# Patient Record
Sex: Female | Born: 1945 | Race: White | Hispanic: No | Smoking: Never smoker
Health system: Southern US, Community
[De-identification: ages and names within clinical notes are randomized; demographics above are authoritative.]

## PROBLEM LIST (undated history)

## (undated) DIAGNOSIS — D6861 Antiphospholipid syndrome: Secondary | ICD-10-CM

## (undated) DIAGNOSIS — D751 Secondary polycythemia: Secondary | ICD-10-CM

## (undated) DIAGNOSIS — G43909 Migraine, unspecified, not intractable, without status migrainosus: Secondary | ICD-10-CM

## (undated) DIAGNOSIS — I639 Cerebral infarction, unspecified: Secondary | ICD-10-CM

## (undated) DIAGNOSIS — E7800 Pure hypercholesterolemia, unspecified: Secondary | ICD-10-CM

## (undated) HISTORY — DX: Secondary polycythemia: D75.1

## (undated) HISTORY — DX: Pure hypercholesterolemia, unspecified: E78.00

## (undated) HISTORY — DX: Cerebral infarction, unspecified: I63.9

## (undated) HISTORY — PX: NASAL SEPTUM SURGERY: SHX37

## (undated) HISTORY — DX: Migraine, unspecified, not intractable, without status migrainosus: G43.909

## (undated) HISTORY — PX: ABDOMINAL HYSTERECTOMY: SHX81

## (undated) HISTORY — DX: Antiphospholipid syndrome: D68.61

---

## 1997-08-06 ENCOUNTER — Encounter
Admission: RE | Admit: 1997-08-06 | Discharge: 1997-11-04 | Payer: Self-pay | Admitting: Physical Medicine & Rehabilitation

## 1997-10-12 ENCOUNTER — Other Ambulatory Visit: Admission: RE | Admit: 1997-10-12 | Discharge: 1997-10-12 | Payer: Self-pay | Admitting: *Deleted

## 1999-01-12 ENCOUNTER — Other Ambulatory Visit: Admission: RE | Admit: 1999-01-12 | Discharge: 1999-01-12 | Payer: Self-pay | Admitting: *Deleted

## 2000-11-25 ENCOUNTER — Other Ambulatory Visit: Admission: RE | Admit: 2000-11-25 | Discharge: 2000-11-25 | Payer: Self-pay | Admitting: *Deleted

## 2001-04-30 ENCOUNTER — Emergency Department (HOSPITAL_COMMUNITY): Admission: EM | Admit: 2001-04-30 | Discharge: 2001-04-30 | Payer: Self-pay | Admitting: Emergency Medicine

## 2001-04-30 ENCOUNTER — Encounter: Payer: Self-pay | Admitting: Emergency Medicine

## 2001-08-29 ENCOUNTER — Encounter: Payer: Self-pay | Admitting: *Deleted

## 2001-08-29 ENCOUNTER — Encounter: Admission: RE | Admit: 2001-08-29 | Discharge: 2001-08-29 | Payer: Self-pay | Admitting: *Deleted

## 2003-07-08 ENCOUNTER — Encounter: Admission: RE | Admit: 2003-07-08 | Discharge: 2003-07-08 | Payer: Self-pay | Admitting: Obstetrics and Gynecology

## 2006-12-26 ENCOUNTER — Ambulatory Visit: Payer: Self-pay | Admitting: Internal Medicine

## 2007-07-18 DIAGNOSIS — R894 Abnormal immunological findings in specimens from other organs, systems and tissues: Secondary | ICD-10-CM

## 2007-07-18 DIAGNOSIS — F329 Major depressive disorder, single episode, unspecified: Secondary | ICD-10-CM

## 2007-07-18 DIAGNOSIS — K219 Gastro-esophageal reflux disease without esophagitis: Secondary | ICD-10-CM | POA: Insufficient documentation

## 2007-07-18 DIAGNOSIS — Z8719 Personal history of other diseases of the digestive system: Secondary | ICD-10-CM

## 2007-07-18 DIAGNOSIS — I6350 Cerebral infarction due to unspecified occlusion or stenosis of unspecified cerebral artery: Secondary | ICD-10-CM

## 2008-11-18 ENCOUNTER — Encounter: Admission: RE | Admit: 2008-11-18 | Discharge: 2008-11-18 | Payer: Self-pay | Admitting: Obstetrics and Gynecology

## 2010-09-05 NOTE — Assessment & Plan Note (Signed)
[facility]                         GASTROENTEROLOGY OFFICE NOTE   DENETRIA, LUEVANOS                        MRN:          [id number]  DATE:12/26/2006                            DOB:          1945/07/27    REFERRING PHYSICIAN:  Tera Mater. Evlyn Kanner, M.D.   REASON FOR CONSULTATION:  Screening colonoscopy.   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  From  her stroke she is left with some residual deficits including mild  processing deficiencies as well as left hand weakness.  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  She did discuss the prospects of discontinuing Coumadin for colonoscopy  with her hematology team.  One option provided was discontinuing  Coumadin but using a Lovenox bridge.  Patient's GI review of systems is  remarkable for intermittent heartburn and indigestion for which he takes  Aciphex.  This works nicely.  No dysphagia, nausea or vomiting.  No  current problems with her bowels.  No bleeding.   PAST MEDICAL HISTORY:  1. Antiphospholipid antibody syndrome.  2. History of stroke.  3. Chronic systemic anticoagulation in the form of Coumadin.  4. History of depression.  5. Information processing impairment.   PAST SURGICAL HISTORY:  None.   ALLERGIES:  No known drug allergies.   CURRENT MEDICATIONS:  1. Coumadin 5 mg alternating with 7.5 mg.  2. Effexor 150 mg daily.  3. Strattera 30 mg daily.  4. Aciphex 20 mg p.r.n.   FAMILY HISTORY:  Negative for gastrointestinal malignancy.   SOCIAL HISTORY:  1. Patient is widowed with 2 children.  2. She lives alone.  3. Has a BS degree.  4. Is an L.P.N. 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  5. She does not smoke.  6. Occasionally uses alcohol.   REVIEW OF SYSTEMS:  Per  Diagnostic Evaluation Form.   PHYSICAL EXAM:  A well-appearing female in no acute distress.  Blood  pressure is 126/90, heart rate 78, weight is 185 pounds.  She is 5 feet  6 inches in height.  HEENT:  Sclera are anicteric, conjunctiva are pink, oral mucosa is  intact, no adenopathy.  LUNGS:  Clear.  HEART:  Regular.  ABDOMEN:  Soft without tenderness, masses or hernia.  EXTREMITIES:  Without edema.   IMPRESSION:  This is a 65 year old female who presents regarding  screening colonoscopy.  The nature of the procedure as well as the  risks, benefits and alternatives were reviewed in great detail.  She  understood and agreed to proceed.  We also discussed in detail the  issues regarding continuing, interrupting or discontinuing Coumadin  therapy.  At this point, it is her preference to proceed with  colonoscopy while on Coumadin, understanding that small polyps could be  dealt with though larger pathology may require a separate colonoscopy at  a later date with anticoagulation being interrupted.  She will again  discuss  this a bit further with her hematology team.  If she does remain  on Coumadin, I did ask her to have her prothrombin time checked a day or  two prior to the examination to make sure it is within therapeutic  range.     Wilhemina Bonito. Marina Goodell, MD  Electronically Signed    JNP/MedQ  DD: 12/26/2006  DT: 12/26/2006  Job #: [id number]   cc:   Jeannett Senior A. Evlyn Kanner, M.D.

## 2011-04-10 ENCOUNTER — Other Ambulatory Visit: Payer: Self-pay | Admitting: Obstetrics and Gynecology

## 2011-04-10 DIAGNOSIS — Z1231 Encounter for screening mammogram for malignant neoplasm of breast: Secondary | ICD-10-CM

## 2011-05-02 ENCOUNTER — Ambulatory Visit: Payer: Self-pay

## 2011-05-07 ENCOUNTER — Ambulatory Visit
Admission: RE | Admit: 2011-05-07 | Discharge: 2011-05-07 | Disposition: A | Discharge status: Home / Self Care | Payer: Medicare Other | Source: Ambulatory Visit | Attending: Obstetrics and Gynecology | Admitting: Obstetrics and Gynecology

## 2011-05-07 DIAGNOSIS — Z1231 Encounter for screening mammogram for malignant neoplasm of breast: Secondary | ICD-10-CM

## 2011-11-07 DIAGNOSIS — D751 Secondary polycythemia: Secondary | ICD-10-CM | POA: Insufficient documentation

## 2011-11-07 DIAGNOSIS — D6861 Antiphospholipid syndrome: Secondary | ICD-10-CM | POA: Insufficient documentation

## 2011-11-07 DIAGNOSIS — E7800 Pure hypercholesterolemia, unspecified: Secondary | ICD-10-CM | POA: Insufficient documentation

## 2011-11-07 DIAGNOSIS — G43909 Migraine, unspecified, not intractable, without status migrainosus: Secondary | ICD-10-CM | POA: Insufficient documentation

## 2012-05-06 DIAGNOSIS — F331 Major depressive disorder, recurrent, moderate: Secondary | ICD-10-CM | POA: Diagnosis not present

## 2012-05-26 DIAGNOSIS — R350 Frequency of micturition: Secondary | ICD-10-CM | POA: Diagnosis not present

## 2012-05-26 DIAGNOSIS — N3946 Mixed incontinence: Secondary | ICD-10-CM | POA: Diagnosis not present

## 2012-06-02 DIAGNOSIS — F331 Major depressive disorder, recurrent, moderate: Secondary | ICD-10-CM | POA: Diagnosis not present

## 2012-06-09 DIAGNOSIS — Z79899 Other long term (current) drug therapy: Secondary | ICD-10-CM | POA: Diagnosis not present

## 2012-06-09 DIAGNOSIS — D6859 Other primary thrombophilia: Secondary | ICD-10-CM | POA: Diagnosis not present

## 2012-06-09 DIAGNOSIS — Z7901 Long term (current) use of anticoagulants: Secondary | ICD-10-CM | POA: Diagnosis not present

## 2012-07-02 DIAGNOSIS — Z7901 Long term (current) use of anticoagulants: Secondary | ICD-10-CM | POA: Insufficient documentation

## 2012-07-02 DIAGNOSIS — I6350 Cerebral infarction due to unspecified occlusion or stenosis of unspecified cerebral artery: Secondary | ICD-10-CM | POA: Diagnosis not present

## 2012-07-02 DIAGNOSIS — M545 Low back pain: Secondary | ICD-10-CM | POA: Diagnosis not present

## 2012-07-02 DIAGNOSIS — M546 Pain in thoracic spine: Secondary | ICD-10-CM | POA: Diagnosis not present

## 2012-07-02 DIAGNOSIS — D6859 Other primary thrombophilia: Secondary | ICD-10-CM | POA: Diagnosis not present

## 2012-07-02 DIAGNOSIS — M999 Biomechanical lesion, unspecified: Secondary | ICD-10-CM | POA: Diagnosis not present

## 2012-08-07 DIAGNOSIS — Z7901 Long term (current) use of anticoagulants: Secondary | ICD-10-CM | POA: Diagnosis not present

## 2012-08-07 DIAGNOSIS — I6350 Cerebral infarction due to unspecified occlusion or stenosis of unspecified cerebral artery: Secondary | ICD-10-CM | POA: Diagnosis not present

## 2012-08-07 DIAGNOSIS — D6859 Other primary thrombophilia: Secondary | ICD-10-CM | POA: Diagnosis not present

## 2012-08-21 DIAGNOSIS — Z7901 Long term (current) use of anticoagulants: Secondary | ICD-10-CM | POA: Diagnosis not present

## 2012-08-21 DIAGNOSIS — I6350 Cerebral infarction due to unspecified occlusion or stenosis of unspecified cerebral artery: Secondary | ICD-10-CM | POA: Diagnosis not present

## 2012-08-21 DIAGNOSIS — D6859 Other primary thrombophilia: Secondary | ICD-10-CM | POA: Diagnosis not present

## 2012-09-11 DIAGNOSIS — M26609 Unspecified temporomandibular joint disorder, unspecified side: Secondary | ICD-10-CM | POA: Diagnosis not present

## 2012-09-11 DIAGNOSIS — M999 Biomechanical lesion, unspecified: Secondary | ICD-10-CM | POA: Diagnosis not present

## 2012-09-11 DIAGNOSIS — M546 Pain in thoracic spine: Secondary | ICD-10-CM | POA: Diagnosis not present

## 2012-09-18 DIAGNOSIS — I10 Essential (primary) hypertension: Secondary | ICD-10-CM | POA: Diagnosis not present

## 2012-09-18 DIAGNOSIS — E559 Vitamin D deficiency, unspecified: Secondary | ICD-10-CM | POA: Diagnosis not present

## 2012-09-18 DIAGNOSIS — E785 Hyperlipidemia, unspecified: Secondary | ICD-10-CM | POA: Diagnosis not present

## 2012-09-18 DIAGNOSIS — R82998 Other abnormal findings in urine: Secondary | ICD-10-CM | POA: Diagnosis not present

## 2012-09-18 DIAGNOSIS — Z7901 Long term (current) use of anticoagulants: Secondary | ICD-10-CM | POA: Diagnosis not present

## 2012-09-22 DIAGNOSIS — F331 Major depressive disorder, recurrent, moderate: Secondary | ICD-10-CM | POA: Diagnosis not present

## 2012-09-26 DIAGNOSIS — Z7901 Long term (current) use of anticoagulants: Secondary | ICD-10-CM | POA: Diagnosis not present

## 2012-09-26 DIAGNOSIS — D6859 Other primary thrombophilia: Secondary | ICD-10-CM | POA: Diagnosis not present

## 2012-09-26 DIAGNOSIS — I6350 Cerebral infarction due to unspecified occlusion or stenosis of unspecified cerebral artery: Secondary | ICD-10-CM | POA: Diagnosis not present

## 2012-09-29 DIAGNOSIS — M899 Disorder of bone, unspecified: Secondary | ICD-10-CM | POA: Diagnosis not present

## 2012-09-29 DIAGNOSIS — D6859 Other primary thrombophilia: Secondary | ICD-10-CM | POA: Diagnosis not present

## 2012-09-29 DIAGNOSIS — E669 Obesity, unspecified: Secondary | ICD-10-CM | POA: Diagnosis not present

## 2012-09-29 DIAGNOSIS — I10 Essential (primary) hypertension: Secondary | ICD-10-CM | POA: Diagnosis not present

## 2012-09-29 DIAGNOSIS — Z7901 Long term (current) use of anticoagulants: Secondary | ICD-10-CM | POA: Diagnosis not present

## 2012-09-29 DIAGNOSIS — F321 Major depressive disorder, single episode, moderate: Secondary | ICD-10-CM | POA: Diagnosis not present

## 2012-09-29 DIAGNOSIS — M949 Disorder of cartilage, unspecified: Secondary | ICD-10-CM | POA: Diagnosis not present

## 2012-09-29 DIAGNOSIS — I6340 Cerebral infarction due to embolism of unspecified cerebral artery: Secondary | ICD-10-CM | POA: Diagnosis not present

## 2012-09-29 DIAGNOSIS — E559 Vitamin D deficiency, unspecified: Secondary | ICD-10-CM | POA: Diagnosis not present

## 2012-10-06 DIAGNOSIS — M76829 Posterior tibial tendinitis, unspecified leg: Secondary | ICD-10-CM | POA: Diagnosis not present

## 2012-10-06 DIAGNOSIS — M25579 Pain in unspecified ankle and joints of unspecified foot: Secondary | ICD-10-CM | POA: Diagnosis not present

## 2012-10-13 DIAGNOSIS — M76829 Posterior tibial tendinitis, unspecified leg: Secondary | ICD-10-CM | POA: Diagnosis not present

## 2012-10-21 DIAGNOSIS — H04129 Dry eye syndrome of unspecified lacrimal gland: Secondary | ICD-10-CM | POA: Diagnosis not present

## 2012-10-22 DIAGNOSIS — Z7901 Long term (current) use of anticoagulants: Secondary | ICD-10-CM | POA: Diagnosis not present

## 2012-10-22 DIAGNOSIS — D6859 Other primary thrombophilia: Secondary | ICD-10-CM | POA: Diagnosis not present

## 2012-10-27 DIAGNOSIS — M76829 Posterior tibial tendinitis, unspecified leg: Secondary | ICD-10-CM | POA: Diagnosis not present

## 2012-11-07 DIAGNOSIS — M76829 Posterior tibial tendinitis, unspecified leg: Secondary | ICD-10-CM | POA: Diagnosis not present

## 2012-11-17 DIAGNOSIS — M76829 Posterior tibial tendinitis, unspecified leg: Secondary | ICD-10-CM | POA: Diagnosis not present

## 2012-11-18 DIAGNOSIS — Z7901 Long term (current) use of anticoagulants: Secondary | ICD-10-CM | POA: Diagnosis not present

## 2012-11-18 DIAGNOSIS — D6859 Other primary thrombophilia: Secondary | ICD-10-CM | POA: Diagnosis not present

## 2012-11-21 DIAGNOSIS — F331 Major depressive disorder, recurrent, moderate: Secondary | ICD-10-CM | POA: Diagnosis not present

## 2012-11-27 DIAGNOSIS — Z7901 Long term (current) use of anticoagulants: Secondary | ICD-10-CM | POA: Diagnosis not present

## 2012-11-27 DIAGNOSIS — I10 Essential (primary) hypertension: Secondary | ICD-10-CM | POA: Diagnosis not present

## 2012-11-27 DIAGNOSIS — E785 Hyperlipidemia, unspecified: Secondary | ICD-10-CM | POA: Diagnosis not present

## 2012-12-09 DIAGNOSIS — Z7901 Long term (current) use of anticoagulants: Secondary | ICD-10-CM | POA: Diagnosis not present

## 2012-12-09 DIAGNOSIS — D6859 Other primary thrombophilia: Secondary | ICD-10-CM | POA: Diagnosis not present

## 2012-12-16 DIAGNOSIS — Z7901 Long term (current) use of anticoagulants: Secondary | ICD-10-CM | POA: Diagnosis not present

## 2012-12-16 DIAGNOSIS — D6859 Other primary thrombophilia: Secondary | ICD-10-CM | POA: Diagnosis not present

## 2012-12-17 DIAGNOSIS — F331 Major depressive disorder, recurrent, moderate: Secondary | ICD-10-CM | POA: Diagnosis not present

## 2013-01-13 DIAGNOSIS — Z7901 Long term (current) use of anticoagulants: Secondary | ICD-10-CM | POA: Diagnosis not present

## 2013-01-13 DIAGNOSIS — D6859 Other primary thrombophilia: Secondary | ICD-10-CM | POA: Diagnosis not present

## 2013-01-15 DIAGNOSIS — Z23 Encounter for immunization: Secondary | ICD-10-CM | POA: Diagnosis not present

## 2013-01-29 ENCOUNTER — Other Ambulatory Visit: Payer: Self-pay

## 2013-01-29 DIAGNOSIS — Z1231 Encounter for screening mammogram for malignant neoplasm of breast: Secondary | ICD-10-CM

## 2013-02-02 ENCOUNTER — Ambulatory Visit
Admission: RE | Admit: 2013-02-02 | Discharge: 2013-02-02 | Disposition: A | Discharge status: Home / Self Care | Payer: Medicare Other | Source: Ambulatory Visit

## 2013-02-02 DIAGNOSIS — Z1231 Encounter for screening mammogram for malignant neoplasm of breast: Secondary | ICD-10-CM | POA: Diagnosis not present

## 2013-02-05 DIAGNOSIS — D6859 Other primary thrombophilia: Secondary | ICD-10-CM | POA: Diagnosis not present

## 2013-02-05 DIAGNOSIS — Z7901 Long term (current) use of anticoagulants: Secondary | ICD-10-CM | POA: Diagnosis not present

## 2013-02-23 DIAGNOSIS — H6120 Impacted cerumen, unspecified ear: Secondary | ICD-10-CM | POA: Diagnosis not present

## 2013-02-23 DIAGNOSIS — H9209 Otalgia, unspecified ear: Secondary | ICD-10-CM | POA: Diagnosis not present

## 2013-03-04 DIAGNOSIS — Z7901 Long term (current) use of anticoagulants: Secondary | ICD-10-CM | POA: Diagnosis not present

## 2013-03-04 DIAGNOSIS — D6859 Other primary thrombophilia: Secondary | ICD-10-CM | POA: Diagnosis not present

## 2013-03-04 DIAGNOSIS — F331 Major depressive disorder, recurrent, moderate: Secondary | ICD-10-CM | POA: Diagnosis not present

## 2013-03-17 DIAGNOSIS — Z7901 Long term (current) use of anticoagulants: Secondary | ICD-10-CM | POA: Diagnosis not present

## 2013-03-17 DIAGNOSIS — D6859 Other primary thrombophilia: Secondary | ICD-10-CM | POA: Diagnosis not present

## 2013-03-23 DIAGNOSIS — E785 Hyperlipidemia, unspecified: Secondary | ICD-10-CM | POA: Diagnosis not present

## 2013-03-23 DIAGNOSIS — F321 Major depressive disorder, single episode, moderate: Secondary | ICD-10-CM | POA: Diagnosis not present

## 2013-03-23 DIAGNOSIS — E669 Obesity, unspecified: Secondary | ICD-10-CM | POA: Diagnosis not present

## 2013-03-23 DIAGNOSIS — D6859 Other primary thrombophilia: Secondary | ICD-10-CM | POA: Diagnosis not present

## 2013-03-23 DIAGNOSIS — Z1331 Encounter for screening for depression: Secondary | ICD-10-CM | POA: Diagnosis not present

## 2013-03-23 DIAGNOSIS — M899 Disorder of bone, unspecified: Secondary | ICD-10-CM | POA: Diagnosis not present

## 2013-03-23 DIAGNOSIS — Z7901 Long term (current) use of anticoagulants: Secondary | ICD-10-CM | POA: Diagnosis not present

## 2013-03-23 DIAGNOSIS — E559 Vitamin D deficiency, unspecified: Secondary | ICD-10-CM | POA: Diagnosis not present

## 2013-03-23 DIAGNOSIS — I10 Essential (primary) hypertension: Secondary | ICD-10-CM | POA: Diagnosis not present

## 2013-04-21 DIAGNOSIS — Z7901 Long term (current) use of anticoagulants: Secondary | ICD-10-CM | POA: Diagnosis not present

## 2013-04-21 DIAGNOSIS — D6859 Other primary thrombophilia: Secondary | ICD-10-CM | POA: Diagnosis not present

## 2013-05-05 DIAGNOSIS — M546 Pain in thoracic spine: Secondary | ICD-10-CM | POA: Diagnosis not present

## 2013-05-05 DIAGNOSIS — M9981 Other biomechanical lesions of cervical region: Secondary | ICD-10-CM | POA: Diagnosis not present

## 2013-05-05 DIAGNOSIS — M542 Cervicalgia: Secondary | ICD-10-CM | POA: Diagnosis not present

## 2013-05-05 DIAGNOSIS — M999 Biomechanical lesion, unspecified: Secondary | ICD-10-CM | POA: Diagnosis not present

## 2013-05-12 DIAGNOSIS — M9981 Other biomechanical lesions of cervical region: Secondary | ICD-10-CM | POA: Diagnosis not present

## 2013-05-12 DIAGNOSIS — M546 Pain in thoracic spine: Secondary | ICD-10-CM | POA: Diagnosis not present

## 2013-05-12 DIAGNOSIS — M999 Biomechanical lesion, unspecified: Secondary | ICD-10-CM | POA: Diagnosis not present

## 2013-05-12 DIAGNOSIS — M542 Cervicalgia: Secondary | ICD-10-CM | POA: Diagnosis not present

## 2013-06-04 DIAGNOSIS — D6859 Other primary thrombophilia: Secondary | ICD-10-CM | POA: Diagnosis not present

## 2013-06-04 DIAGNOSIS — Z7901 Long term (current) use of anticoagulants: Secondary | ICD-10-CM | POA: Diagnosis not present

## 2013-07-14 DIAGNOSIS — D6859 Other primary thrombophilia: Secondary | ICD-10-CM | POA: Diagnosis not present

## 2013-07-14 DIAGNOSIS — Z7901 Long term (current) use of anticoagulants: Secondary | ICD-10-CM | POA: Diagnosis not present

## 2013-07-29 DIAGNOSIS — M999 Biomechanical lesion, unspecified: Secondary | ICD-10-CM | POA: Diagnosis not present

## 2013-07-29 DIAGNOSIS — M542 Cervicalgia: Secondary | ICD-10-CM | POA: Diagnosis not present

## 2013-07-29 DIAGNOSIS — M9981 Other biomechanical lesions of cervical region: Secondary | ICD-10-CM | POA: Diagnosis not present

## 2013-07-29 DIAGNOSIS — M546 Pain in thoracic spine: Secondary | ICD-10-CM | POA: Diagnosis not present

## 2013-07-29 DIAGNOSIS — M26609 Unspecified temporomandibular joint disorder, unspecified side: Secondary | ICD-10-CM | POA: Diagnosis not present

## 2013-08-18 DIAGNOSIS — F331 Major depressive disorder, recurrent, moderate: Secondary | ICD-10-CM | POA: Diagnosis not present

## 2013-08-25 DIAGNOSIS — Z7901 Long term (current) use of anticoagulants: Secondary | ICD-10-CM | POA: Diagnosis not present

## 2013-08-25 DIAGNOSIS — D6859 Other primary thrombophilia: Secondary | ICD-10-CM | POA: Diagnosis not present

## 2013-09-02 DIAGNOSIS — R350 Frequency of micturition: Secondary | ICD-10-CM | POA: Diagnosis not present

## 2013-09-02 DIAGNOSIS — N3946 Mixed incontinence: Secondary | ICD-10-CM | POA: Diagnosis not present

## 2013-09-08 DIAGNOSIS — Z124 Encounter for screening for malignant neoplasm of cervix: Secondary | ICD-10-CM | POA: Diagnosis not present

## 2013-09-16 DIAGNOSIS — E785 Hyperlipidemia, unspecified: Secondary | ICD-10-CM | POA: Diagnosis not present

## 2013-09-16 DIAGNOSIS — R5383 Other fatigue: Secondary | ICD-10-CM | POA: Diagnosis not present

## 2013-09-16 DIAGNOSIS — F321 Major depressive disorder, single episode, moderate: Secondary | ICD-10-CM | POA: Diagnosis not present

## 2013-09-16 DIAGNOSIS — I6340 Cerebral infarction due to embolism of unspecified cerebral artery: Secondary | ICD-10-CM | POA: Diagnosis not present

## 2013-09-16 DIAGNOSIS — I10 Essential (primary) hypertension: Secondary | ICD-10-CM | POA: Diagnosis not present

## 2013-09-16 DIAGNOSIS — E559 Vitamin D deficiency, unspecified: Secondary | ICD-10-CM | POA: Diagnosis not present

## 2013-09-16 DIAGNOSIS — Z7901 Long term (current) use of anticoagulants: Secondary | ICD-10-CM | POA: Diagnosis not present

## 2013-09-16 DIAGNOSIS — R5381 Other malaise: Secondary | ICD-10-CM | POA: Diagnosis not present

## 2013-09-16 DIAGNOSIS — D6859 Other primary thrombophilia: Secondary | ICD-10-CM | POA: Diagnosis not present

## 2013-09-17 DIAGNOSIS — H259 Unspecified age-related cataract: Secondary | ICD-10-CM | POA: Diagnosis not present

## 2013-09-17 DIAGNOSIS — H04129 Dry eye syndrome of unspecified lacrimal gland: Secondary | ICD-10-CM | POA: Diagnosis not present

## 2013-09-17 DIAGNOSIS — H5200 Hypermetropia, unspecified eye: Secondary | ICD-10-CM | POA: Diagnosis not present

## 2013-09-17 DIAGNOSIS — H524 Presbyopia: Secondary | ICD-10-CM | POA: Diagnosis not present

## 2013-09-25 DIAGNOSIS — D6859 Other primary thrombophilia: Secondary | ICD-10-CM | POA: Diagnosis not present

## 2013-09-25 DIAGNOSIS — Z7901 Long term (current) use of anticoagulants: Secondary | ICD-10-CM | POA: Diagnosis not present

## 2013-09-30 DIAGNOSIS — L280 Lichen simplex chronicus: Secondary | ICD-10-CM | POA: Diagnosis not present

## 2013-10-15 DIAGNOSIS — M545 Low back pain, unspecified: Secondary | ICD-10-CM | POA: Diagnosis not present

## 2013-10-15 DIAGNOSIS — M999 Biomechanical lesion, unspecified: Secondary | ICD-10-CM | POA: Diagnosis not present

## 2013-10-15 DIAGNOSIS — M546 Pain in thoracic spine: Secondary | ICD-10-CM | POA: Diagnosis not present

## 2013-10-26 DIAGNOSIS — Z7901 Long term (current) use of anticoagulants: Secondary | ICD-10-CM | POA: Diagnosis not present

## 2013-10-26 DIAGNOSIS — D6859 Other primary thrombophilia: Secondary | ICD-10-CM | POA: Diagnosis not present

## 2013-11-30 DIAGNOSIS — Z7901 Long term (current) use of anticoagulants: Secondary | ICD-10-CM | POA: Diagnosis not present

## 2013-11-30 DIAGNOSIS — D6859 Other primary thrombophilia: Secondary | ICD-10-CM | POA: Diagnosis not present

## 2013-12-14 DIAGNOSIS — D6859 Other primary thrombophilia: Secondary | ICD-10-CM | POA: Diagnosis not present

## 2013-12-14 DIAGNOSIS — Z7901 Long term (current) use of anticoagulants: Secondary | ICD-10-CM | POA: Diagnosis not present

## 2013-12-15 DIAGNOSIS — I6340 Cerebral infarction due to embolism of unspecified cerebral artery: Secondary | ICD-10-CM | POA: Diagnosis not present

## 2013-12-15 DIAGNOSIS — E559 Vitamin D deficiency, unspecified: Secondary | ICD-10-CM | POA: Diagnosis not present

## 2013-12-15 DIAGNOSIS — Z23 Encounter for immunization: Secondary | ICD-10-CM | POA: Diagnosis not present

## 2013-12-15 DIAGNOSIS — D6859 Other primary thrombophilia: Secondary | ICD-10-CM | POA: Diagnosis not present

## 2013-12-15 DIAGNOSIS — F321 Major depressive disorder, single episode, moderate: Secondary | ICD-10-CM | POA: Diagnosis not present

## 2013-12-15 DIAGNOSIS — E785 Hyperlipidemia, unspecified: Secondary | ICD-10-CM | POA: Diagnosis not present

## 2013-12-15 DIAGNOSIS — I10 Essential (primary) hypertension: Secondary | ICD-10-CM | POA: Diagnosis not present

## 2013-12-15 DIAGNOSIS — Z7901 Long term (current) use of anticoagulants: Secondary | ICD-10-CM | POA: Diagnosis not present

## 2013-12-15 DIAGNOSIS — M949 Disorder of cartilage, unspecified: Secondary | ICD-10-CM | POA: Diagnosis not present

## 2013-12-15 DIAGNOSIS — M899 Disorder of bone, unspecified: Secondary | ICD-10-CM | POA: Diagnosis not present

## 2014-01-12 DIAGNOSIS — D6859 Other primary thrombophilia: Secondary | ICD-10-CM | POA: Diagnosis not present

## 2014-01-12 DIAGNOSIS — Z7901 Long term (current) use of anticoagulants: Secondary | ICD-10-CM | POA: Diagnosis not present

## 2014-01-14 DIAGNOSIS — Z23 Encounter for immunization: Secondary | ICD-10-CM | POA: Diagnosis not present

## 2014-02-05 DIAGNOSIS — F331 Major depressive disorder, recurrent, moderate: Secondary | ICD-10-CM | POA: Diagnosis not present

## 2014-02-09 DIAGNOSIS — D6861 Antiphospholipid syndrome: Secondary | ICD-10-CM | POA: Diagnosis not present

## 2014-02-09 DIAGNOSIS — Z7901 Long term (current) use of anticoagulants: Secondary | ICD-10-CM | POA: Diagnosis not present

## 2014-03-09 DIAGNOSIS — D6861 Antiphospholipid syndrome: Secondary | ICD-10-CM | POA: Diagnosis not present

## 2014-03-09 DIAGNOSIS — Z7901 Long term (current) use of anticoagulants: Secondary | ICD-10-CM | POA: Diagnosis not present

## 2014-04-19 DIAGNOSIS — D6859 Other primary thrombophilia: Secondary | ICD-10-CM | POA: Diagnosis not present

## 2014-04-19 DIAGNOSIS — Z7901 Long term (current) use of anticoagulants: Secondary | ICD-10-CM | POA: Diagnosis not present

## 2014-07-29 ENCOUNTER — Other Ambulatory Visit: Payer: Self-pay

## 2014-07-29 DIAGNOSIS — Z1231 Encounter for screening mammogram for malignant neoplasm of breast: Secondary | ICD-10-CM

## 2014-08-04 ENCOUNTER — Ambulatory Visit: Payer: Medicare Other

## 2014-12-02 ENCOUNTER — Other Ambulatory Visit: Payer: Self-pay | Admitting: *Deleted

## 2015-02-09 ENCOUNTER — Ambulatory Visit
Admission: RE | Admit: 2015-02-09 | Discharge: 2015-02-09 | Disposition: A | Discharge status: Home / Self Care | Payer: Medicare Other | Source: Ambulatory Visit

## 2015-02-09 DIAGNOSIS — Z1231 Encounter for screening mammogram for malignant neoplasm of breast: Secondary | ICD-10-CM

## 2015-05-20 ENCOUNTER — Ambulatory Visit (INDEPENDENT_AMBULATORY_CARE_PROVIDER_SITE_OTHER): Payer: Medicare Other | Admitting: Internal Medicine

## 2015-05-20 ENCOUNTER — Encounter: Payer: Self-pay | Admitting: Internal Medicine

## 2015-05-20 VITALS — BP 124/92 | HR 88 | Temp 98.1°F | Resp 16 | Ht 65.5 in | Wt 191.0 lb

## 2015-05-20 DIAGNOSIS — D6861 Antiphospholipid syndrome: Secondary | ICD-10-CM

## 2015-05-20 DIAGNOSIS — Z7901 Long term (current) use of anticoagulants: Secondary | ICD-10-CM

## 2015-05-20 DIAGNOSIS — F32A Depression, unspecified: Secondary | ICD-10-CM

## 2015-05-20 DIAGNOSIS — F988 Other specified behavioral and emotional disorders with onset usually occurring in childhood and adolescence: Secondary | ICD-10-CM

## 2015-05-20 DIAGNOSIS — F909 Attention-deficit hyperactivity disorder, unspecified type: Secondary | ICD-10-CM

## 2015-05-20 DIAGNOSIS — K219 Gastro-esophageal reflux disease without esophagitis: Secondary | ICD-10-CM | POA: Diagnosis not present

## 2015-05-20 DIAGNOSIS — F329 Major depressive disorder, single episode, unspecified: Secondary | ICD-10-CM

## 2015-05-20 DIAGNOSIS — E7800 Pure hypercholesterolemia, unspecified: Secondary | ICD-10-CM

## 2015-05-20 DIAGNOSIS — N3281 Overactive bladder: Secondary | ICD-10-CM

## 2015-05-20 DIAGNOSIS — I6350 Cerebral infarction due to unspecified occlusion or stenosis of unspecified cerebral artery: Secondary | ICD-10-CM | POA: Diagnosis not present

## 2015-05-20 DIAGNOSIS — F419 Anxiety disorder, unspecified: Secondary | ICD-10-CM

## 2015-05-20 MED ORDER — SIMVASTATIN 80 MG PO TABS: 80.0000 mg | ORAL_TABLET | Freq: Every day | ORAL | Status: DC

## 2015-05-20 MED ORDER — ERGOCALCIFEROL 1.25 MG (50000 UT) PO CAPS: 50000.0000 [IU] | ORAL_CAPSULE | ORAL | Status: DC

## 2015-05-20 NOTE — Assessment & Plan Note (Signed)
Occasional GERD Takes aciphex only as needed

## 2015-05-20 NOTE — Assessment & Plan Note (Signed)
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

## 2015-05-20 NOTE — Patient Instructions (Addendum)
It was nice to meet you.    Medications reviewed and updated.  No changes recommended at this time.  Your prescription(s) have been submitted to your pharmacy. Please take as directed and contact our office if you believe you are having problem(s) with the medication(s).   Please schedule followup in 6 months

## 2015-05-20 NOTE — Progress Notes (Signed)
Subjective:    Patient ID: Kathleen Lin, female    DOB: February 20, 1946, 70 y.o.   MRN: [id number]  HPI She is here to establish with a new pcp.   She follows with ENT, psychiatry, urology and Dr Anabel Bene at [facility] for her antiphopholipid syndrome.  CVA:  She had a stroke in 1998 and was then diagnosed with antiphospholipid syndrome.  She has residual left handed weakness and is more emotional since the stroke.  She denies any other residual symptoms.  She is taking warfarin and his is monitored at [facility].  She is also on simvastatin.    Antiphospholipid syndrome:  She was diagnosed when she had her stroke.  She is following with a specialist at [facility], Dr. Anabel Bene.  She is on warfarin and this is monitored at [facility].  She denies any other episodes of clots.    Hyperlipidemia: She is taking her medication daily. She is compliant with a low fat/cholesterol diet. She is exercising regularly. She denies myalgias.   Depression, anxiety:  She is following with psychiatry.  She is taking her medications daily as prescribed.  Overactive bladder;  She follows with urology.  She feels the medication is working well.    GERD:  She is taking her medication prn only.  She denies any GERD symptoms and feels her GERD is well controlled.    Medications and allergies reviewed with patient and updated if appropriate.  Patient Active Problem List   Diagnosis Date Noted  . DEPRESSION 07/18/2007  . CVA 07/18/2007  . GERD 07/18/2007  . OTHER&UNSPEC NONSPECIFIC IMMUNOLOGICAL FINDINGS 07/18/2007  . HEMORRHOIDS, HX OF 07/18/2007    No current outpatient prescriptions on file prior to visit.   No current facility-administered medications on file prior to visit.    Past Medical History  Diagnosis Date  . Antiphospholipid antibody syndrome (HCC)   . Migraines   . Polycythemia   . Hypercholesteremia   . Stroke [facility]) [id number]    Past Surgical History  Procedure Laterality Date  . Abdominal hysterectomy    .  Nasal septum surgery      Social History   Social History  . Marital Status: Widowed    Spouse Name: N/A  . Number of Children: N/A  . Years of Education: N/A   Social History Main Topics  . Smoking status: Never Smoker   . Smokeless tobacco: Never Used  . Alcohol Use: Yes  . Drug Use: No  . Sexual Activity: Not Asked   Other Topics Concern  . None   Social History Narrative  . None    Family History  Problem Relation Age of Onset  . Hypertension Mother   . Heart disease Father   . Hyperlipidemia Father   . Miscarriages / Stillbirths Maternal Grandfather     Review of Systems  Constitutional: Negative for fever.  HENT: Positive for postnasal drip. Negative for hearing loss.   Eyes: Negative for visual disturbance.  Respiratory: Positive for cough. Negative for shortness of breath and wheezing.   Cardiovascular: Negative for chest pain, palpitations and leg swelling.  Gastrointestinal: Negative for nausea, abdominal pain, diarrhea, constipation and blood in stool.       Occ GERD  Genitourinary: Negative for dysuria and hematuria.  Musculoskeletal: Negative for myalgias, back pain and arthralgias.  Neurological: Positive for dizziness. Negative for headaches.  Psychiatric/Behavioral: Positive for dysphoric mood. The patient is nervous/anxious.        Objective:   Filed Vitals:  05/20/15 1010  BP: 124/92  Pulse: 88  Temp: 98.1 F (36.7 C)  Resp: 16   Filed Weights   05/20/15 1010  Weight: 191 lb (86.637 kg)   Body mass index is 31.29 kg/(m^2).   Physical Exam Constitutional: She appears well-developed and well-nourished. No distress.  HENT:  Head: Normocephalic and atraumatic.  Right Ear: External ear normal. Normal ear canal and TM Left Ear: External ear normal.  Normal ear canal and TM Mouth/Throat: Oropharynx is clear and moist.  Normal bilateral ear canals and tympanic membranes  Eyes: Conjunctivae and EOM are normal.  Neck: Neck supple. No  tracheal deviation present. No thyromegaly present.  No carotid bruit  Cardiovascular: Normal rate, regular rhythm and normal heart sounds.   No murmur heard.  No edema. Pulmonary/Chest: Effort normal and breath sounds normal. No respiratory distress. She has no wheezes. She has no rales.  Abdominal: Soft. She exhibits no distension. There is no tenderness.  Lymphadenopathy: She has no cervical adenopathy.  Skin: Skin is warm and dry. She is not diaphoretic.  Psychiatric: She has a normal mood and affect. Her behavior is normal.       Assessment & Plan:   See Problem List for Assessment and Plan of chronic medical problems.  F/u in 6 months

## 2015-05-20 NOTE — Progress Notes (Signed)
Pre visit review using our clinic review tool, if applicable. No additional management support is needed unless otherwise documented below in the visit note. 

## 2015-05-21 DIAGNOSIS — F419 Anxiety disorder, unspecified: Secondary | ICD-10-CM | POA: Insufficient documentation

## 2015-05-21 DIAGNOSIS — F988 Other specified behavioral and emotional disorders with onset usually occurring in childhood and adolescence: Secondary | ICD-10-CM | POA: Insufficient documentation

## 2015-05-21 NOTE — Assessment & Plan Note (Signed)
Follows with Dr Anabel Bene at [facility] On warfarin

## 2015-05-21 NOTE — Assessment & Plan Note (Signed)
Follows with psychiatry Stable meds per psych 

## 2015-05-21 NOTE — Assessment & Plan Note (Signed)
Taking simvastatin daily Continue same 

## 2015-05-21 NOTE — Assessment & Plan Note (Signed)
Follows with psychiatry Stable meds per psych

## 2015-05-21 NOTE — Assessment & Plan Note (Signed)
Stable Residual left handed weakness, more emotional On warfarin, simvastatin BP typically well controlled

## 2015-05-27 ENCOUNTER — Telehealth: Payer: Self-pay | Admitting: Internal Medicine

## 2015-05-27 NOTE — Telephone Encounter (Signed)
Rec'd from [facility] forward 33 pages to Dr.Burns

## 2015-07-16 ENCOUNTER — Ambulatory Visit (INDEPENDENT_AMBULATORY_CARE_PROVIDER_SITE_OTHER): Payer: Medicare Other | Admitting: Internal Medicine

## 2015-07-16 ENCOUNTER — Encounter: Payer: Self-pay | Admitting: Internal Medicine

## 2015-07-16 VITALS — BP 148/98 | HR 103 | Temp 98.3°F | Resp 16 | Wt 190.0 lb

## 2015-07-16 DIAGNOSIS — R040 Epistaxis: Secondary | ICD-10-CM | POA: Diagnosis not present

## 2015-07-16 NOTE — Progress Notes (Signed)
Subjective:    Patient ID: Kathleen Lin, female    DOB: 11/03/1945, 70 y.o.   MRN: [id number]  HPI She is here for an acute visit for nose bleeding from her right nostril.    One week ago she went to have her lip waxed and had the hairs removed inside her nose.  The girl used a q-tip with wax on it to get the hair out of her nose out.  It was painful.  She did not have any bleeding at the time, but wonders if there was trauma done.  The other night she felt like she had cust in her nose and she used her little finger and scratched the inside of her nose.  There was some bleeding, which stopped with pressure.  The next morning she blew her nose and there was blood in it.  She had has some blood yesterday with blowing her nose as well.   Her bleeding has always stopped with pressure.  The longest it took was 30 minutes.  Her last inr was in the therapeutic range.  She is taking her warfarin as prescribed.  She is very anxious about the bleeding.  She tried to call ENT Friday but it was the end of the day.  She has been using ocean nasal spray and last night put an antibiotic ointment in her nose.    She had a rhinoplasty years ago.   Medications and allergies reviewed with patient and updated if appropriate.  Patient Active Problem List   Diagnosis Date Noted  . Anxiety 05/21/2015  . ADD (attention deficit disorder) 05/21/2015  . Overactive bladder 05/20/2015  . Long term current use of anticoagulant 07/02/2012  . Antiphospholipid syndrome (HCC) 11/07/2011  . Hypercholesterolemia 11/07/2011  . Headache, migraine 11/07/2011  . Polycythemia 11/07/2011  . Depression 07/18/2007  . Cerebral artery occlusion with cerebral infarction (HCC) 07/18/2007  . GERD 07/18/2007  . OTHER&UNSPEC NONSPECIFIC IMMUNOLOGICAL FINDINGS 07/18/2007  . HEMORRHOIDS, HX OF 07/18/2007    Current Outpatient Prescriptions on File Prior to Visit  Medication Sig Dispense Refill  . atomoxetine (STRATTERA) 100 MG  capsule Take 100 mg by mouth daily.    . Azelastine-Fluticasone (DYMISTA) 137-50 MCG/ACT SUSP Place into the nose as needed.    . calcium gluconate 500 MG tablet Take 1 tablet by mouth 2 (two) times daily.    . cycloSPORINE (RESTASIS) 0.05 % ophthalmic emulsion 1 drop 2 (two) times daily.    . ergocalciferol (VITAMIN D2) 50000 units capsule Take 1 capsule (50,000 Units total) by mouth once a week. 12 capsule 3  . LORazepam (ATIVAN) 0.5 MG tablet Take 0.5 mg by mouth as needed for anxiety.    . mirabegron ER (MYRBETRIQ) 50 MG TB24 tablet Take 50 mg by mouth daily.    . Omega-3 Fatty Acids (FISH OIL) 1000 MG CAPS Take 1,000 mg by mouth.    . RABEprazole (ACIPHEX) 20 MG tablet Take 20 mg by mouth daily as needed.    . simvastatin (ZOCOR) 80 MG tablet Take 1 tablet (80 mg total) by mouth daily. 90 tablet 3  . traZODone (DESYREL) 50 MG tablet Take 50 mg by mouth at bedtime.    Marland Kitchen. warfarin (COUMADIN) 5 MG tablet Take 5 mg by mouth as directed.     No current facility-administered medications on file prior to visit.    Past Medical History  Diagnosis Date  . Antiphospholipid antibody syndrome (HCC)   . Migraines   . Polycythemia   .  Hypercholesteremia   . Stroke [facility]) [id number]    Past Surgical History  Procedure Laterality Date  . Abdominal hysterectomy    . Nasal septum surgery      Social History   Social History  . Marital Status: Widowed    Spouse Name: N/A  . Number of Children: N/A  . Years of Education: N/A   Social History Main Topics  . Smoking status: Never Smoker   . Smokeless tobacco: Never Used  . Alcohol Use: Yes  . Drug Use: No  . Sexual Activity: Not Asked   Other Topics Concern  . None   Social History Narrative   Not exercising regularly    Family History  Problem Relation Age of Onset  . Hypertension Mother   . Heart disease Father   . Hyperlipidemia Father   . Miscarriages / Stillbirths Maternal Grandfather     Review of Systems    Constitutional: Negative for fever and chills.  HENT: Positive for congestion (minimal from allergies). Negative for sinus pressure and sore throat.   Gastrointestinal: Negative for nausea.  Neurological: Negative for dizziness, light-headedness and headaches.       Objective:   Filed Vitals:   07/16/15 1051  BP: 148/98  Pulse: 103  Temp: 98.3 F (36.8 C)  Resp: 16   Filed Weights   07/16/15 1051  Weight: 190 lb (86.183 kg)   Body mass index is 31.13 kg/(m^2).   Physical Exam  Constitutional: She appears well-developed and well-nourished. No distress.  HENT:  Mouth/Throat: Oropharynx is clear and moist. No oropharyngeal exudate.  Right nostril with a small scratch on the septum, no active blood, dried blood present, no significant congestion or discharge, no sinus pressure  Eyes: Conjunctivae are normal.          Assessment & Plan:   Epistaxis Her nose bleeding is likely related to the trauma from her finger, possible from wax removal of her nasal hairs Blowing her nose has caused recurrent bleeding The bleeding sound minimal and is stopped with a cold washcloth within 30 minutes No treatment needed emergently, but we discussed that if the bleeding does not stop she may need to see ENT  Continue ocean nasal spray - use frequently Apply antibiotic ointment at night Continue warfarin for now unless bleeding increases Call with questions or concerns.

## 2015-07-16 NOTE — Progress Notes (Signed)
Pre visit review using our clinic review tool, if applicable. No additional management support is needed unless otherwise documented below in the visit note. 

## 2015-07-16 NOTE — Patient Instructions (Signed)

## 2015-08-03 ENCOUNTER — Ambulatory Visit (INDEPENDENT_AMBULATORY_CARE_PROVIDER_SITE_OTHER): Payer: Medicare Other

## 2015-08-03 DIAGNOSIS — Z23 Encounter for immunization: Secondary | ICD-10-CM

## 2015-10-03 ENCOUNTER — Other Ambulatory Visit: Payer: Self-pay | Admitting: Internal Medicine

## 2015-10-03 ENCOUNTER — Telehealth: Payer: Self-pay | Admitting: Internal Medicine

## 2015-10-03 NOTE — Telephone Encounter (Signed)
Please help send in today, pt is out of this med and [facility] is the pharmacy.

## 2015-10-03 NOTE — Telephone Encounter (Signed)
Ok to fill 

## 2015-10-03 NOTE — Telephone Encounter (Signed)
Spoke with pt. RX [facility] to [facility]

## 2015-10-03 NOTE — Telephone Encounter (Signed)
Please advise on refill request

## 2015-10-03 NOTE — Telephone Encounter (Signed)
Patient is requesting that we call in traZODone (DESYREL) 50 MG tablet [[id number]][[id number]]  [facility] [address]. She states that we have not filled this, and it was last done by her last provider.

## 2015-11-17 ENCOUNTER — Encounter: Payer: Self-pay | Admitting: Internal Medicine

## 2015-11-17 ENCOUNTER — Ambulatory Visit (INDEPENDENT_AMBULATORY_CARE_PROVIDER_SITE_OTHER): Payer: Medicare Other | Admitting: Internal Medicine

## 2015-11-17 VITALS — BP 136/86 | HR 80 | Temp 98.3°F | Resp 16 | Ht 66.0 in | Wt 186.0 lb

## 2015-11-17 DIAGNOSIS — Z1159 Encounter for screening for other viral diseases: Secondary | ICD-10-CM | POA: Diagnosis not present

## 2015-11-17 DIAGNOSIS — K219 Gastro-esophageal reflux disease without esophagitis: Secondary | ICD-10-CM

## 2015-11-17 DIAGNOSIS — I6350 Cerebral infarction due to unspecified occlusion or stenosis of unspecified cerebral artery: Secondary | ICD-10-CM | POA: Diagnosis not present

## 2015-11-17 DIAGNOSIS — E7800 Pure hypercholesterolemia, unspecified: Secondary | ICD-10-CM

## 2015-11-17 NOTE — Progress Notes (Signed)
Pre visit review using our clinic review tool, if applicable. No additional management support is needed unless otherwise documented below in the visit note. 

## 2015-11-17 NOTE — Patient Instructions (Addendum)

## 2015-11-17 NOTE — Assessment & Plan Note (Signed)
GERD controlled Continue as needed medication only

## 2015-11-17 NOTE — Assessment & Plan Note (Signed)
Check lipids, cmp, tsh Taking simvastatin 80 mg daily Stressed regular exercise

## 2015-11-17 NOTE — Progress Notes (Signed)
Subjective:    Patient ID: Kathleen Lin, female    DOB: 27-Feb-1946, 70 y.o.   MRN: [id number]  HPI She is here for follow up.  Hyperlipidemia: She is taking her medication daily. She is compliant with a low fat/cholesterol diet. She is not exercising regularly. She denies myalgias.   H/o CVA with residual right hand weakness and increased emotion, antiphospholipid syndrome:  She is on warfarin and follows with Dr Anabel Bene at [facility].   Overactive bladder:  She is following with urology and takes myrbetriq daily.  She was   GERD:  She is taking her medication daily as needed only.  She denies any GERD symptoms and feels her GERD is well controlled.    ADD, depression, anxiety:  She is following with psychiatry.  She is taking her medication as prescribed.     Medications and allergies reviewed with patient and updated if appropriate.  Patient Active Problem List   Diagnosis Date Noted  . Anxiety 05/21/2015  . ADD (attention deficit disorder) 05/21/2015  . Overactive bladder 05/20/2015  . Long term current use of anticoagulant 07/02/2012  . Antiphospholipid syndrome (HCC) 11/07/2011  . Hypercholesterolemia 11/07/2011  . Headache, migraine 11/07/2011  . Polycythemia 11/07/2011  . Depression 07/18/2007  . Cerebral artery occlusion with cerebral infarction (HCC) 07/18/2007  . GERD 07/18/2007  . OTHER&UNSPEC NONSPECIFIC IMMUNOLOGICAL FINDINGS 07/18/2007  . HEMORRHOIDS, HX OF 07/18/2007    Current Outpatient Prescriptions on File Prior to Visit  Medication Sig Dispense Refill  . atomoxetine (STRATTERA) 100 MG capsule Take 100 mg by mouth daily.    . Azelastine-Fluticasone (DYMISTA) 137-50 MCG/ACT SUSP Place into the nose as needed.    . calcium gluconate 500 MG tablet Take 1 tablet by mouth 2 (two) times daily.    . cycloSPORINE (RESTASIS) 0.05 % ophthalmic emulsion 1 drop 2 (two) times daily.    . ergocalciferol (VITAMIN D2) 50000 units capsule Take 1 capsule (50,000 Units total)  by mouth once a week. 12 capsule 3  . LORazepam (ATIVAN) 0.5 MG tablet Take 0.5 mg by mouth as needed for anxiety.    . mirabegron ER (MYRBETRIQ) 50 MG TB24 tablet Take 50 mg by mouth daily.    . Omega-3 Fatty Acids (FISH OIL) 1000 MG CAPS Take 1,000 mg by mouth.    . RABEprazole (ACIPHEX) 20 MG tablet Take 20 mg by mouth daily as needed.    . simvastatin (ZOCOR) 80 MG tablet Take 1 tablet (80 mg total) by mouth daily. 90 tablet 3  . traZODone (DESYREL) 50 MG tablet TAKE 1 TABLET BY MOUTH AT BEDTIME AS NEEDED 30 tablet 5  . warfarin (COUMADIN) 5 MG tablet Take 5 mg by mouth as directed. INR should be 2.5-3.5     No current facility-administered medications on file prior to visit.     Past Medical History:  Diagnosis Date  . Antiphospholipid antibody syndrome (HCC)   . Hypercholesteremia   . Migraines   . Polycythemia   . Stroke [facility]) [id number]    Past Surgical History:  Procedure Laterality Date  . ABDOMINAL HYSTERECTOMY    . NASAL SEPTUM SURGERY      Social History   Social History  . Marital status: Widowed    Spouse name: N/A  . Number of children: N/A  . Years of education: N/A   Social History Main Topics  . Smoking status: Never Smoker  . Smokeless tobacco: Never Used  . Alcohol use Yes  . Drug use:  No  . Sexual activity: Not on file   Other Topics Concern  . Not on file   Social History Narrative   Not exercising regularly    Family History  Problem Relation Age of Onset  . Hypertension Mother   . Heart disease Father   . Hyperlipidemia Father   . Miscarriages / Stillbirths Maternal Grandfather     Review of Systems  Constitutional: Negative for chills and fever.  Respiratory: Negative for cough, shortness of breath and wheezing.   Cardiovascular: Negative for chest pain, palpitations and leg swelling.  Gastrointestinal: Negative for abdominal pain.  Neurological: Negative for dizziness, light-headedness and headaches.       Objective:    Vitals:   11/17/15 0936  BP: 136/86  Pulse: 80  Resp: 16  Temp: 98.3 F (36.8 C)   Filed Weights   11/17/15 0936  Weight: 186 lb (84.4 kg)   Body mass index is 30.02 kg/m.   Physical Exam Constitutional: Appears well-developed and well-nourished. No distress.  HENT:  Head: Normocephalic and atraumatic.  Neck: Neck supple. No tracheal deviation present. No thyromegaly present.  Cardiovascular: Normal rate, regular rhythm and normal heart sounds.   No murmur heard. No carotid bruit  Pulmonary/Chest: Effort normal and breath sounds normal. No respiratory distress. No has no wheezes. No rales.  Musculoskeletal: No edema.  Lymphadenopathy: No cervical adenopathy.  Skin: Skin is warm and dry. Not diaphoretic.  Psychiatric: Normal mood and affect. Behavior is normal.         Assessment & Plan:   See Problem List for Assessment and Plan of chronic medical problems.

## 2015-11-17 NOTE — Assessment & Plan Note (Signed)
Stable On warfarin, statin, bp well controlled Continue current medications

## 2015-12-06 ENCOUNTER — Other Ambulatory Visit (INDEPENDENT_AMBULATORY_CARE_PROVIDER_SITE_OTHER): Payer: Medicare Other

## 2015-12-06 DIAGNOSIS — E7800 Pure hypercholesterolemia, unspecified: Secondary | ICD-10-CM | POA: Diagnosis not present

## 2015-12-06 DIAGNOSIS — Z1159 Encounter for screening for other viral diseases: Secondary | ICD-10-CM

## 2015-12-06 LAB — LIPID PANEL
Cholesterol: 127 mg/dL (ref 0–200)
HDL: 34.4 mg/dL — ABNORMAL LOW (ref >39.00)
LDL CALC: 77 mg/dL (ref 0–99)
NonHDL: 92.35
Total CHOL/HDL Ratio: 4
Triglycerides: 75 mg/dL (ref 0.0–149.0)
VLDL: 15 mg/dL (ref 0.0–40.0)

## 2015-12-06 LAB — COMPREHENSIVE METABOLIC PANEL
ALK PHOS: 69 U/L (ref 39–117)
ALT: 11 U/L (ref 0–35)
AST: 19 U/L (ref 0–37)
Albumin: 4.1 g/dL (ref 3.5–5.2)
BUN: 19 mg/dL (ref 6–23)
CHLORIDE: 106 meq/L (ref 96–112)
CO2: 27 meq/L (ref 19–32)
Calcium: 9.4 mg/dL (ref 8.4–10.5)
Creatinine, Ser: 1.09 mg/dL (ref 0.40–1.20)
GFR: 53.08 mL/min — ABNORMAL LOW (ref >60.00)
GLUCOSE: 109 mg/dL — AB (ref 70–99)
POTASSIUM: 4.2 meq/L (ref 3.5–5.1)
SODIUM: 140 meq/L (ref 135–145)
Total Bilirubin: 0.7 mg/dL (ref 0.2–1.2)
Total Protein: 7.2 g/dL (ref 6.0–8.3)

## 2015-12-06 LAB — CBC WITH DIFFERENTIAL/PLATELET
Basophils Absolute: 0 10*3/uL (ref 0.0–0.1)
Basophils Relative: 0.3 % (ref 0.0–3.0)
EOS PCT: 1 % (ref 0.0–5.0)
Eosinophils Absolute: 0.1 10*3/uL (ref 0.0–0.7)
HCT: 47.1 % — ABNORMAL HIGH (ref 36.0–46.0)
Hemoglobin: 15.9 g/dL — ABNORMAL HIGH (ref 12.0–15.0)
LYMPHS PCT: 17.3 % (ref 12.0–46.0)
Lymphs Abs: 1.1 10*3/uL (ref 0.7–4.0)
MCHC: 33.8 g/dL (ref 30.0–36.0)
MCV: 86.7 fl (ref 78.0–100.0)
MONO ABS: 0.6 10*3/uL (ref 0.1–1.0)
Monocytes Relative: 9.2 % (ref 3.0–12.0)
NEUTROS PCT: 72.2 % (ref 43.0–77.0)
Neutro Abs: 4.8 10*3/uL (ref 1.4–7.7)
PLATELETS: 175 10*3/uL (ref 150.0–400.0)
RBC: 5.43 Mil/uL — AB (ref 3.87–5.11)
RDW: 13.1 % (ref 11.5–15.5)
WBC: 6.6 10*3/uL (ref 4.0–10.5)

## 2015-12-06 LAB — TSH: TSH: 2.77 u[IU]/mL (ref 0.35–4.50)

## 2015-12-07 LAB — HEPATITIS C ANTIBODY: HCV AB: NEGATIVE

## 2015-12-09 ENCOUNTER — Encounter: Payer: Self-pay | Admitting: Emergency Medicine

## 2015-12-12 ENCOUNTER — Telehealth: Payer: Self-pay | Admitting: Internal Medicine

## 2015-12-12 NOTE — Telephone Encounter (Signed)
Pt request to speak to the assistant, stating she needed more information.

## 2015-12-13 NOTE — Telephone Encounter (Signed)
Pt spoke with Delaney Meigsamara 12/13/15

## 2015-12-20 ENCOUNTER — Ambulatory Visit (INDEPENDENT_AMBULATORY_CARE_PROVIDER_SITE_OTHER): Payer: Medicare Other | Admitting: Internal Medicine

## 2015-12-20 ENCOUNTER — Encounter: Payer: Self-pay | Admitting: Internal Medicine

## 2015-12-20 VITALS — BP 152/92 | HR 88 | Temp 98.0°F | Resp 16 | Ht 66.0 in | Wt 183.0 lb

## 2015-12-20 DIAGNOSIS — R739 Hyperglycemia, unspecified: Secondary | ICD-10-CM | POA: Diagnosis not present

## 2015-12-20 DIAGNOSIS — Z780 Asymptomatic menopausal state: Secondary | ICD-10-CM

## 2015-12-20 DIAGNOSIS — R030 Elevated blood-pressure reading, without diagnosis of hypertension: Secondary | ICD-10-CM

## 2015-12-20 DIAGNOSIS — R350 Frequency of micturition: Secondary | ICD-10-CM | POA: Diagnosis not present

## 2015-12-20 DIAGNOSIS — N289 Disorder of kidney and ureter, unspecified: Secondary | ICD-10-CM

## 2015-12-20 DIAGNOSIS — Z13820 Encounter for screening for osteoporosis: Secondary | ICD-10-CM

## 2015-12-20 LAB — POCT URINALYSIS DIPSTICK
BILIRUBIN UA: NEGATIVE
Blood, UA: NEGATIVE
GLUCOSE UA: NEGATIVE
KETONES UA: NEGATIVE
Leukocytes, UA: NEGATIVE
Nitrite, UA: NEGATIVE
Protein, UA: NEGATIVE
Urobilinogen, UA: 0.2
pH, UA: 5.5

## 2015-12-20 NOTE — Patient Instructions (Addendum)
Have blood work a few days prior to your next visit.   Test(s) ordered today.  We will discuss the results at your next visit.    Medications reviewed and updated.  No changes recommended at this time.

## 2015-12-20 NOTE — Progress Notes (Signed)
Pre visit review using our clinic review tool, if applicable. No additional management support is needed unless otherwise documented below in the visit note. 

## 2015-12-20 NOTE — Assessment & Plan Note (Signed)
We reviewed her blood work and person. We will recheck her blood work just prior to her next visit so we can discuss in person We will obtain her prior records-we may artery Them and I will review them to see if this is something that is new or old Discussed treatment including plenty of fluids, avoiding NSAIDs which she does in making sure her blood pressure is well controlled Monitor for now

## 2015-12-20 NOTE — Assessment & Plan Note (Signed)
Will check A1c with her next blood work Discussed weight loss and regular exercise Discussed low carb/sugar diet

## 2015-12-20 NOTE — Progress Notes (Signed)
Subjective:    Patient ID: Kathleen Lin, female    DOB: 07/17/1945, 70 y.o.   MRN: [id number]  HPI The patient is here to review her last blood work in person.  She has been more anxious recently. She does see a psychiatrist and has been started on sertraline.  She is very anxious regarding her last blood work and wanted to come in to discuss it.  Decreased kidney function: Her last blood work showed slightly decreased kidney function. She is unsure if this is new or old and do not have prior blood work for her. She did have her records transferred and will check to see if we have received them. She does drink plenty of water throughout the day and does not take any NSAIDs.  Elevated blood pressure: Her blood pressure is slightly elevated here today. She does not monitor her on a regular basis, but does have it checked at other doctor's visits. She feels it has been controlled. She has not been exercising recently. She is compliant with a low sodium diet.  She was concerned regarding her elevated sugar. She has not been compliant with a low sugar diet. She is currently not exercising.  Her brother was recently diagnosed with non-Hodgkin's lymphoma. This has caused increased stress and of course she worries about getting this herself.   Medications and allergies reviewed with patient and updated if appropriate.  Patient Active Problem List   Diagnosis Date Noted  . Renal insufficiency 12/20/2015  . Anxiety 05/21/2015  . ADD (attention deficit disorder) 05/21/2015  . Overactive bladder 05/20/2015  . Long term current use of anticoagulant 07/02/2012  . Antiphospholipid syndrome (HCC) 11/07/2011  . Hypercholesterolemia 11/07/2011  . Headache, migraine 11/07/2011  . Polycythemia 11/07/2011  . Depression 07/18/2007  . Cerebral artery occlusion with cerebral infarction (HCC) 07/18/2007  . GERD 07/18/2007  . HEMORRHOIDS, HX OF 07/18/2007    Current Outpatient Prescriptions on File  Prior to Visit  Medication Sig Dispense Refill  . atomoxetine (STRATTERA) 100 MG capsule Take 100 mg by mouth daily.    . Azelastine-Fluticasone (DYMISTA) 137-50 MCG/ACT SUSP Place into the nose as needed.    . calcium gluconate 500 MG tablet Take 1 tablet by mouth 2 (two) times daily.    . cycloSPORINE (RESTASIS) 0.05 % ophthalmic emulsion 1 drop 2 (two) times daily.    . ergocalciferol (VITAMIN D2) 50000 units capsule Take 1 capsule (50,000 Units total) by mouth once a week. 12 capsule 3  . LORazepam (ATIVAN) 0.5 MG tablet Take 0.5 mg by mouth as needed for anxiety.    . mirabegron ER (MYRBETRIQ) 50 MG TB24 tablet Take 50 mg by mouth daily.    . RABEprazole (ACIPHEX) 20 MG tablet Take 20 mg by mouth daily as needed.    . simvastatin (ZOCOR) 80 MG tablet Take 1 tablet (80 mg total) by mouth daily. 90 tablet 3  . traZODone (DESYREL) 50 MG tablet TAKE 1 TABLET BY MOUTH AT BEDTIME AS NEEDED 30 tablet 5  . warfarin (COUMADIN) 5 MG tablet Take 5 mg by mouth as directed. INR should be 2.5-3.5     No current facility-administered medications on file prior to visit.     Past Medical History:  Diagnosis Date  . Antiphospholipid antibody syndrome (HCC)   . Hypercholesteremia   . Migraines   . Polycythemia   . Stroke [facility](HCC) [id number]) [id number]    Past Surgical History:  Procedure Laterality Date  . ABDOMINAL HYSTERECTOMY    .  NASAL SEPTUM SURGERY      Social History   Social History  . Marital status: Widowed    Spouse name: N/A  . Number of children: N/A  . Years of education: N/A   Social History Main Topics  . Smoking status: Never Smoker  . Smokeless tobacco: Never Used  . Alcohol use Yes  . Drug use: No  . Sexual activity: Not Asked   Other Topics Concern  . None   Social History Narrative   Not exercising regularly    Family History  Problem Relation Age of Onset  . Hypertension Mother   . Heart disease Father   . Hyperlipidemia Father   . Miscarriages / Stillbirths Maternal  Grandfather     Review of Systems     Objective:   Vitals:   12/20/15 1115  BP: (!) 152/92  Pulse: 88  Resp: 16  Temp: 98 F (36.7 C)   Filed Weights   12/20/15 1115  Weight: 183 lb (83 kg)   Body mass index is 29.54 kg/m.   Physical Exam    Constitutional: Appears well-developed and well-nourished. No distress.  Psychiatric: anxious mood and affect. Behavior is normal.     Assessment & Plan:   DEXA ordered to screen for osteoporosis  See Problem List for Assessment and Plan of chronic medical problems.   20 minutes were spent face-to-face with the patient, over 50% of which was spent counseling regarding her last blood work and discussing follow up

## 2015-12-20 NOTE — Assessment & Plan Note (Addendum)
BP elevated here today She will start monitoring it regularly Continue low-sodium diet Work on weight loss and start regular exercise

## 2016-01-03 ENCOUNTER — Telehealth: Payer: Self-pay | Admitting: Internal Medicine

## 2016-01-03 NOTE — Telephone Encounter (Signed)
Patient Name: Kathleen Lin DOB: 04/21/1946 Initial Comment Caller states she has been belching. Nurse Assessment Nurse: Roma KayserForsythe, RN, Santina Evansatherine Date/Time (Eastern Time): 01/03/2016 10:41:49 AM Confirm and document reason for call. If symptomatic, describe symptoms. You must click the next button to save text entered. ---pt states she had lunch out yesterday and ever since has had belching, not sure what to take OTC, is not having any abd pain or nausea or diarrhea just burping a lot this morning. Has the patient traveled out of the country within the last 30 days? ---Not Applicable Does the patient have any new or worsening symptoms? ---Yes Will a triage be completed? ---No Select reason for no triage. ---Other Please document clinical information provided and list any resource used. ---advised to call back if any abd pain, nausea, indigestion or diarrhea. try taking Tums and see if that helps. verbalized understanding Guidelines Guideline Title Affirmed Question Affirmed Notes Final Disposition User Clinical Call Roma KayserForsythe, RN, Santina Evansatherine

## 2016-01-03 NOTE — Telephone Encounter (Signed)
LVM informing pt

## 2016-01-03 NOTE — Telephone Encounter (Signed)
If tums does not help can take otc zantac twice daily for a few days and then stop it or otc prilosec 20 mg daily for a few days and stop it.

## 2016-01-10 ENCOUNTER — Ambulatory Visit (INDEPENDENT_AMBULATORY_CARE_PROVIDER_SITE_OTHER): Payer: Medicare Other

## 2016-01-10 DIAGNOSIS — Z23 Encounter for immunization: Secondary | ICD-10-CM

## 2016-01-18 ENCOUNTER — Other Ambulatory Visit: Payer: Medicare Other

## 2016-01-18 ENCOUNTER — Ambulatory Visit (INDEPENDENT_AMBULATORY_CARE_PROVIDER_SITE_OTHER)
Admission: RE | Admit: 2016-01-18 | Discharge: 2016-01-18 | Disposition: A | Discharge status: Home / Self Care | Payer: Medicare Other | Source: Ambulatory Visit | Attending: Internal Medicine | Admitting: Internal Medicine

## 2016-01-18 DIAGNOSIS — Z780 Asymptomatic menopausal state: Secondary | ICD-10-CM

## 2016-01-18 DIAGNOSIS — Z13820 Encounter for screening for osteoporosis: Secondary | ICD-10-CM

## 2016-01-19 ENCOUNTER — Encounter: Payer: Self-pay | Admitting: Internal Medicine

## 2016-01-19 DIAGNOSIS — M8580 Other specified disorders of bone density and structure, unspecified site: Secondary | ICD-10-CM | POA: Insufficient documentation

## 2016-01-20 ENCOUNTER — Other Ambulatory Visit (INDEPENDENT_AMBULATORY_CARE_PROVIDER_SITE_OTHER): Payer: Medicare Other

## 2016-01-20 DIAGNOSIS — R739 Hyperglycemia, unspecified: Secondary | ICD-10-CM | POA: Diagnosis not present

## 2016-01-20 DIAGNOSIS — N289 Disorder of kidney and ureter, unspecified: Secondary | ICD-10-CM | POA: Diagnosis not present

## 2016-01-20 DIAGNOSIS — R030 Elevated blood-pressure reading, without diagnosis of hypertension: Secondary | ICD-10-CM | POA: Diagnosis not present

## 2016-01-20 LAB — CBC WITH DIFFERENTIAL/PLATELET
BASOS ABS: 0 10*3/uL (ref 0.0–0.1)
Basophils Relative: 0.3 % (ref 0.0–3.0)
Eosinophils Absolute: 0.1 10*3/uL (ref 0.0–0.7)
Eosinophils Relative: 0.7 % (ref 0.0–5.0)
HCT: 46.4 % — ABNORMAL HIGH (ref 36.0–46.0)
Hemoglobin: 16 g/dL — ABNORMAL HIGH (ref 12.0–15.0)
LYMPHS ABS: 1 10*3/uL (ref 0.7–4.0)
LYMPHS PCT: 14.9 % (ref 12.0–46.0)
MCHC: 34.4 g/dL (ref 30.0–36.0)
MCV: 86 fl (ref 78.0–100.0)
MONOS PCT: 9 % (ref 3.0–12.0)
Monocytes Absolute: 0.6 10*3/uL (ref 0.1–1.0)
Neutro Abs: 5.3 10*3/uL (ref 1.4–7.7)
Neutrophils Relative %: 75.1 % (ref 43.0–77.0)
PLATELETS: 184 10*3/uL (ref 150.0–400.0)
RBC: 5.39 Mil/uL — ABNORMAL HIGH (ref 3.87–5.11)
RDW: 13.1 % (ref 11.5–15.5)
WBC: 7 10*3/uL (ref 4.0–10.5)

## 2016-01-20 LAB — COMPREHENSIVE METABOLIC PANEL
ALT: 12 U/L (ref 0–35)
AST: 17 U/L (ref 0–37)
Albumin: 3.9 g/dL (ref 3.5–5.2)
Alkaline Phosphatase: 72 U/L (ref 39–117)
BILIRUBIN TOTAL: 0.6 mg/dL (ref 0.2–1.2)
BUN: 19 mg/dL (ref 6–23)
CALCIUM: 9 mg/dL (ref 8.4–10.5)
CHLORIDE: 106 meq/L (ref 96–112)
CO2: 29 mEq/L (ref 19–32)
Creatinine, Ser: 1.06 mg/dL (ref 0.40–1.20)
GFR: 54.79 mL/min — AB (ref >60.00)
GLUCOSE: 98 mg/dL (ref 70–99)
POTASSIUM: 4.4 meq/L (ref 3.5–5.1)
Sodium: 140 mEq/L (ref 135–145)
Total Protein: 7.2 g/dL (ref 6.0–8.3)

## 2016-01-20 LAB — HEMOGLOBIN A1C: Hgb A1c MFr Bld: 5.6 % (ref 4.6–6.5)

## 2016-01-21 ENCOUNTER — Encounter: Payer: Self-pay | Admitting: Internal Medicine

## 2016-02-02 ENCOUNTER — Telehealth: Payer: Self-pay | Admitting: Internal Medicine

## 2016-02-02 NOTE — Telephone Encounter (Signed)
Please advise 

## 2016-02-02 NOTE — Telephone Encounter (Signed)
Patient has dymista.  Would like to know if she could use that.

## 2016-02-02 NOTE — Telephone Encounter (Signed)
Yes, ok to take dymista

## 2016-02-02 NOTE — Telephone Encounter (Signed)
Patient called to advise that she was seen at the dentist. The dentist advised her that she needs a decongestant. She is asking for a decongestant that she can use since she is on coumadin. Please call the patient.

## 2016-02-03 NOTE — Telephone Encounter (Signed)
LVM giving MDs response.

## 2016-04-04 ENCOUNTER — Other Ambulatory Visit: Payer: Self-pay | Admitting: Internal Medicine

## 2016-05-06 ENCOUNTER — Encounter: Payer: Self-pay | Admitting: Internal Medicine

## 2016-05-14 ENCOUNTER — Encounter: Payer: Self-pay | Admitting: Internal Medicine

## 2016-05-20 ENCOUNTER — Encounter: Payer: Self-pay | Admitting: Internal Medicine

## 2016-05-20 NOTE — Progress Notes (Signed)
Subjective:    Patient ID: Kathleen Lin, female    DOB: March 04, 1946, 71 y.o.   MRN: [id number]  HPI Here for medicare wellness exam and an annual physical exam.   I have personally reviewed and have noted 1.The patient's medical and social history 2.Their use of alcohol, tobacco or illicit drugs 3.Their current medications and supplements 4.The patient's functional ability including ADL's, fall risks, home safety risks and hearing or visual impairment. 5.Diet and physical activities 6.Evidence for depression or mood disorders 7.Care team reviewed - Hematology - Dr Anabel Bene at [facility], Psychiatry - [facility] every 3-4 months - Dr Jennelle Human, urology - [facility]   Are there smokers in your home (other than you)? No  Risk Factors Exercise: none - will start silver sneakers Dietary issues discussed: well balanced, eats good amount of fruits and vegetables;  Tries to limit to sweets, but struggles with that.   Cardiac risk factors: advanced age, hyperlipidemia, and obesity (BMI >= 30 kg/m2).  Depression Screen - well controlled - follows with Psych  Have you felt down, depressed or hopeless? No  Have you felt little interest or pleasure in doing things?  No  Activities of Daily Living In your present state of health, do you have any difficulty performing the following activities?:  Driving? No Managing money?  No Feeding yourself? No Getting from bed to chair? No Climbing a flight of stairs? No Preparing food and eating?: No Bathing or showering? No Getting dressed: No Getting to/using the toilet? No Moving around from place to place: No In the past year have you fallen or had a near fall?: No   Are you sexually active?  No  Do you have more than one partner?  N/A  Hearing Difficulties: No Do you often ask people to speak up or repeat themselves? No Do you experience ringing or noises in your ears?  No Do you have difficulty understanding soft or whispered voices? No Vision:              Any change in vision: no             Up to date with eye exam: yes  Memory:  Do you feel that you have a problem with memory? Yes, recall only  Do you often misplace items? No  Do you feel safe at home?  Yes  Cognitive Testing  Alert, Orientated? Yes  Normal Appearance? Yes  Recall of three objects?  Yes  Can perform simple calculations? Yes  Displays appropriate judgment? Yes  Can read the correct time from a watch face? Yes   Advanced Directives have been discussed with the patient? Yes   Medications and allergies reviewed with patient and updated if appropriate.  Patient Active Problem List   Diagnosis Date Noted  . Osteopenia 01/19/2016  . Renal insufficiency 12/20/2015  . Hyperglycemia 12/20/2015  . Elevated blood pressure (not hypertension) 12/20/2015  . Anxiety 05/21/2015  . ADD (attention deficit disorder) 05/21/2015  . Overactive bladder 05/20/2015  . Long term current use of anticoagulant 07/02/2012  . Antiphospholipid syndrome (HCC) 11/07/2011  . Hypercholesterolemia 11/07/2011  . Headache, migraine 11/07/2011  . Polycythemia 11/07/2011  . Depression 07/18/2007  . Cerebral artery occlusion with cerebral infarction (HCC) 07/18/2007  . GERD 07/18/2007  . HEMORRHOIDS, HX OF 07/18/2007    Current Outpatient Prescriptions on File Prior to Visit  Medication Sig Dispense Refill  . atomoxetine (STRATTERA) 100 MG capsule Take 100 mg by mouth daily.    Marland Kitchen  Azelastine-Fluticasone (DYMISTA) 137-50 MCG/ACT SUSP Place into the nose as needed.    . calcium gluconate 500 MG tablet Take 1 tablet by mouth 2 (two) times daily.    . cycloSPORINE (RESTASIS) 0.05 % ophthalmic emulsion 1 drop 2 (two) times daily.    . ergocalciferol (VITAMIN D2) 50000 units capsule Take 1 capsule (50,000 Units total) by mouth once a week. 12 capsule 3  . LORazepam (ATIVAN) 0.5 MG tablet Take 0.5 mg by mouth  as needed for anxiety.    . mirabegron ER (MYRBETRIQ) 50 MG TB24 tablet Take 50 mg by mouth daily.    . RABEprazole (ACIPHEX) 20 MG tablet Take 20 mg by mouth daily as needed.    . sertraline (ZOLOFT) 50 MG tablet     . simvastatin (ZOCOR) 80 MG tablet Take 1 tablet (80 mg total) by mouth daily. 90 tablet 3  . traZODone (DESYREL) 50 MG tablet TAKE 1 TABLET BY MOUTH AT BEDTIME AS NEEDED 30 tablet 5  . warfarin (COUMADIN) 5 MG tablet Take 5 mg by mouth as directed. INR should be 2.5-3.5     No current facility-administered medications on file prior to visit.     Past Medical History:  Diagnosis Date  . Antiphospholipid antibody syndrome (HCC)   . Hypercholesteremia   . Migraines   . Polycythemia   . Stroke [facility](HCC) [id number]) [id number]    Past Surgical History:  Procedure Laterality Date  . ABDOMINAL HYSTERECTOMY    . NASAL SEPTUM SURGERY      Social History   Social History  . Marital status: Widowed    Spouse name: N/A  . Number of children: N/A  . Years of education: N/A   Social History Main Topics  . Smoking status: Never Smoker  . Smokeless tobacco: Never Used  . Alcohol use Yes  . Drug use: No  . Sexual activity: Not Asked   Other Topics Concern  . None   Social History Narrative   Not exercising regularly    Family History  Problem Relation Age of Onset  . Hypertension Mother   . Heart disease Father   . Hyperlipidemia Father   . Miscarriages / Stillbirths Maternal Grandfather   . Non-Hodgkin's lymphoma Brother     Review of Systems  Constitutional: Negative for appetite change, chills, fatigue and fever.  HENT: Positive for sneezing. Negative for hearing loss and tinnitus.   Eyes: Negative for visual disturbance.  Respiratory: Negative for cough, shortness of breath and wheezing.   Cardiovascular: Negative for chest pain, palpitations and leg swelling.  Gastrointestinal: Positive for constipation (occ - sennokot prn). Negative for abdominal pain, blood in stool,  diarrhea and nausea.       Rare GERD  Genitourinary: Positive for frequency. Negative for dysuria and hematuria.  Musculoskeletal: Negative for arthralgias, back pain and myalgias.  Skin: Negative for color change and rash.  Neurological: Positive for light-headedness (occ). Negative for headaches.  Psychiatric/Behavioral: Negative for dysphoric mood. The patient is not nervous/anxious.        Objective:   Vitals:   05/21/16 1037  BP: 128/80  Pulse: 84  Resp: 16  Temp: 97.8 F (36.6 C)   Filed Weights   05/21/16 1037  Weight: 187 lb (84.8 kg)   Body mass index is 30.18 kg/m.  Wt Readings from Last 3 Encounters:  05/21/16 187 lb (84.8 kg)  12/20/15 183 lb (83 kg)  11/17/15 186 lb (84.4 kg)     Physical Exam Constitutional: She appears well-developed  and well-nourished. No distress.  HENT:  Head: Normocephalic and atraumatic.  Right Ear: External ear normal. Normal ear canal and TM Left Ear: External ear normal.  Normal ear canal and TM Mouth/Throat: Oropharynx is clear and moist.  Eyes: Conjunctivae and EOM are normal.  Neck: Neck supple. No tracheal deviation present. No thyromegaly present.  No carotid bruit  Cardiovascular: Normal rate, regular rhythm and normal heart sounds.   No murmur heard.  No edema. Pulmonary/Chest: Effort normal and breath sounds normal. No respiratory distress. She has no wheezes. She has no rales.  Breast: deferred by patient Abdominal: Soft. She exhibits no distension. There is no tenderness.  Lymphadenopathy: She has no cervical adenopathy.  Skin: Skin is warm and dry. She is not diaphoretic.  Psychiatric: She has a normal mood and affect. Her behavior is normal.         Assessment & Plan:   Wellness Exam: Immunizations  Up to date  Colonoscopy - due - wants to do cologuard - will contact insurance and see if it is covered Mammogram  Up to date  Gyn   -  No longer sees Dexa   Up to date  Eye exam  Up to date  Hearing loss  -  has had hearing eval - no hearing loss  Memory concerns/difficulties - recall only - likely normal for age Independent of ADLs - fully Stressed the importance of regular exercise   Patient received copy of preventative screening tests/immunizations recommended for the next 5-10 years.  Physical exam: Screening blood work deferred today - she is up to date with  Her blood work - will check blood work at there next visit Immunizations  Up to date  Colonoscopy - due - will look into cologuard Mammogram  Up to date  Gyn   - no longer seeing Dexa   Up to date  Eye exams  Up to date  EKG - no EKG on file - will get an EKG at her next visit  Exercise - none -will start silver sneakers Weight - obese, advised to work on weight loss Skin  - no concerns Substance abuse -- none  See Problem List for Assessment and Plan of chronic medical problems.  FU in 6 month

## 2016-05-20 NOTE — Patient Instructions (Addendum)
Contact your insurance company and see if cologuard is covered - if it is please let us know.    Finish the high dose vitamin D and then start 2000 units of vitamin D daily.    Kathleen Lin , Thank you for taking time to come for your Medicare Wellness Visit. I appreciate your ongoing commitment to your health goals. Please review the following plan we discussed and let me know if I can assist you in the future.   These are the goals we discussed: Goals    Start regular exercise      This is a list of the screening recommended for you and due dates:  Health Maintenance  Topic Date Due  . Colon Cancer Screening  01/26/1998  . Mammogram  02/08/2017  . DEXA scan (bone density measurement)  01/17/2018  . Tetanus Vaccine  08/02/2025  . Flu Shot  Completed  . Shingles Vaccine  Completed  .  Hepatitis C: One time screening is recommended by Center for Disease Control  (CDC) for  adults born from [birth year] through 1963.   Completed  . Pneumonia vaccines  Completed     All other Health Maintenance issues reviewed.   All recommended immunizations and age-appropriate screenings are up-to-date or discussed.  No immunizations administered today.   Medications reviewed and updated.  No changes recommended at this time.  Your prescription(s) have been submitted to your pharmacy. Please take as directed and contact our office if you believe you are having problem(s) with the medication(s).    Please followup in 6 months   Health Maintenance, Female Introduction Adopting a healthy lifestyle and getting preventive care can go a long way to promote health and wellness. Talk with your health care provider about what schedule of regular examinations is right for you. This is a good chance for you to check in with your provider about disease prevention and staying healthy. In between checkups, there are plenty of things you can do on your own. Experts have done a lot of research about which lifestyle  changes and preventive measures are most likely to keep you healthy. Ask your health care provider for more information. Weight and diet Eat a healthy diet  Be sure to include plenty of vegetables, fruits, low-fat dairy products, and lean protein.  Do not eat a lot of foods high in solid fats, added sugars, or salt.  Get regular exercise. This is one of the most important things you can do for your health.  Most adults should exercise for at least 150 minutes each week. The exercise should increase your heart rate and make you sweat (moderate-intensity exercise).  Most adults should also do strengthening exercises at least twice a week. This is in addition to the moderate-intensity exercise. Maintain a healthy weight  Body mass index (BMI) is a measurement that can be used to identify possible weight problems. It estimates body fat based on height and weight. Your health care provider can help determine your BMI and help you achieve or maintain a healthy weight.  For females 72 years of age and older:  A BMI below 18.5 is considered underweight.  A BMI of 18.5 to 24.9 is normal.  A BMI of 25 to 29.9 is considered overweight.  A BMI of 30 and above is considered obese. Watch levels of cholesterol and blood lipids  You should start having your blood tested for lipids and cholesterol at 69 years of age, then have this test every 5 years.  You may need to have your cholesterol levels checked more often if:  Your lipid or cholesterol levels are high.  You are older than 69 years of age.  You are at high risk for heart disease. Cancer screening Lung Cancer  Lung cancer screening is recommended for adults 54-32 years old who are at high risk for lung cancer because of a history of smoking.  A yearly low-dose CT scan of the lungs is recommended for people who:  Currently smoke.  Have quit within the past 15 years.  Have at least a 30-pack-year history of smoking. A pack year  is smoking an average of one pack of cigarettes a day for 1 year.  Yearly screening should continue until it has been 15 years since you quit.  Yearly screening should stop if you develop a health problem that would prevent you from having lung cancer treatment. Breast Cancer  Practice breast self-awareness. This means understanding how your breasts normally appear and feel.  It also means doing regular breast self-exams. Let your health care provider know about any changes, no matter how small.  If you are in your 20s or 30s, you should have a clinical breast exam (CBE) by a health care provider every 1-3 years as part of a regular health exam.  If you are 55 or older, have a CBE every year. Also consider having a breast X-ray (mammogram) every year.  If you have a family history of breast cancer, talk to your health care provider about genetic screening.  If you are at high risk for breast cancer, talk to your health care provider about having an MRI and a mammogram every year.  Breast cancer gene (BRCA) assessment is recommended for women who have family members with BRCA-related cancers. BRCA-related cancers include:  Breast.  Ovarian.  Tubal.  Peritoneal cancers.  Results of the assessment will determine the need for genetic counseling and BRCA1 and BRCA2 testing. Cervical Cancer  Your health care provider may recommend that you be screened regularly for cancer of the pelvic organs (ovaries, uterus, and vagina). This screening involves a pelvic examination, including checking for microscopic changes to the surface of your cervix (Pap test). You may be encouraged to have this screening done every 3 years, beginning at age 43.  For women ages 46-65, health care providers may recommend pelvic exams and Pap testing every 3 years, or they may recommend the Pap and pelvic exam, combined with testing for human papilloma virus (HPV), every 5 years. Some types of HPV increase your risk  of cervical cancer. Testing for HPV may also be done on women of any age with unclear Pap test results.  Other health care providers may not recommend any screening for nonpregnant women who are considered low risk for pelvic cancer and who do not have symptoms. Ask your health care provider if a screening pelvic exam is right for you.  If you have had past treatment for cervical cancer or a condition that could lead to cancer, you need Pap tests and screening for cancer for at least 20 years after your treatment. If Pap tests have been discontinued, your risk factors (such as having a new sexual partner) need to be reassessed to determine if screening should resume. Some women have medical problems that increase the chance of getting cervical cancer. In these cases, your health care provider may recommend more frequent screening and Pap tests. Colorectal Cancer  This type of cancer can be detected and often prevented.  Routine colorectal cancer screening usually begins at 70 years of age and continues through 69 years of age.  Your health care provider may recommend screening at an earlier age if you have risk factors for colon cancer.  Your health care provider may also recommend using home test kits to check for hidden blood in the stool.  A small camera at the end of a tube can be used to examine your colon directly (sigmoidoscopy or colonoscopy). This is done to check for the earliest forms of colorectal cancer.  Routine screening usually begins at age 16.  Direct examination of the colon should be repeated every 5-10 years through 69 years of age. However, you may need to be screened more often if early forms of precancerous polyps or small growths are found. Skin Cancer  Check your skin from head to toe regularly.  Tell your health care provider about any new moles or changes in moles, especially if there is a change in a mole's shape or color.  Also tell your health care provider if  you have a mole that is larger than the size of a pencil eraser.  Always use sunscreen. Apply sunscreen liberally and repeatedly throughout the day.  Protect yourself by wearing long sleeves, pants, a wide-brimmed hat, and sunglasses whenever you are outside. Heart disease, diabetes, and high blood pressure  High blood pressure causes heart disease and increases the risk of stroke. High blood pressure is more likely to develop in:  People who have blood pressure in the high end of the normal range (130-139/85-89 mm Hg).  People who are overweight or obese.  People who are African American.  If you are 60-35 years of age, have your blood pressure checked every 3-5 years. If you are 66 years of age or older, have your blood pressure checked every year. You should have your blood pressure measured twice-once when you are at a hospital or clinic, and once when you are not at a hospital or clinic. Record the average of the two measurements. To check your blood pressure when you are not at a hospital or clinic, you can use:  An automated blood pressure machine at a pharmacy.  A home blood pressure monitor.  If you are between 26 years and 80 years old, ask your health care provider if you should take aspirin to prevent strokes.  Have regular diabetes screenings. This involves taking a blood sample to check your fasting blood sugar level.  If you are at a normal weight and have a low risk for diabetes, have this test once every three years after 69 years of age.  If you are overweight and have a high risk for diabetes, consider being tested at a younger age or more often. Preventing infection Hepatitis B  If you have a higher risk for hepatitis B, you should be screened for this virus. You are considered at high risk for hepatitis B if:  You were born in a country where hepatitis B is common. Ask your health care provider which countries are considered high risk.  Your parents were born  in a high-risk country, and you have not been immunized against hepatitis B (hepatitis B vaccine).  You have HIV or AIDS.  You use needles to inject street drugs.  You live with someone who has hepatitis B.  You have had sex with someone who has hepatitis B.  You get hemodialysis treatment.  You take certain medicines for conditions, including cancer, organ transplantation, and  autoimmune conditions. Hepatitis C  Blood testing is recommended for:  Everyone born from [birth year] through 1963.  Anyone with known risk factors for hepatitis C. Sexually transmitted infections (STIs)  You should be screened for sexually transmitted infections (STIs) including gonorrhea and chlamydia if:  You are sexually active and are younger than 69 years of age.  You are older than 69 years of age and your health care provider tells you that you are at risk for this type of infection.  Your sexual activity has changed since you were last screened and you are at an increased risk for chlamydia or gonorrhea. Ask your health care provider if you are at risk.  If you do not have HIV, but are at risk, it may be recommended that you take a prescription medicine daily to prevent HIV infection. This is called pre-exposure prophylaxis (PrEP). You are considered at risk if:  You are sexually active and do not regularly use condoms or know the HIV status of your partner(s).  You take drugs by injection.  You are sexually active with a partner who has HIV. Talk with your health care provider about whether you are at high risk of being infected with HIV. If you choose to begin PrEP, you should first be tested for HIV. You should then be tested every 3 months for as long as you are taking PrEP. Pregnancy  If you are premenopausal and you may become pregnant, ask your health care provider about preconception counseling.  If you may become pregnant, take 400 to 800 micrograms (mcg) of folic acid every day.  If you  want to prevent pregnancy, talk to your health care provider about birth control (contraception). Osteoporosis and menopause  Osteoporosis is a disease in which the bones lose minerals and strength with aging. This can result in serious bone fractures. Your risk for osteoporosis can be identified using a bone density scan.  If you are 34 years of age or older, or if you are at risk for osteoporosis and fractures, ask your health care provider if you should be screened.  Ask your health care provider whether you should take a calcium or vitamin D supplement to lower your risk for osteoporosis.  Menopause may have certain physical symptoms and risks.  Hormone replacement therapy may reduce some of these symptoms and risks. Talk to your health care provider about whether hormone replacement therapy is right for you. Follow these instructions at home:  Schedule regular health, dental, and eye exams.  Stay current with your immunizations.  Do not use any tobacco products including cigarettes, chewing tobacco, or electronic cigarettes.  If you are pregnant, do not drink alcohol.  If you are breastfeeding, limit how much and how often you drink alcohol.  Limit alcohol intake to no more than 1 drink per day for nonpregnant women. One drink equals 12 ounces of beer, 5 ounces of wine, or 1 ounces of hard liquor.  Do not use street drugs.  Do not share needles.  Ask your health care provider for help if you need support or information about quitting drugs.  Tell your health care provider if you often feel depressed.  Tell your health care provider if you have ever been abused or do not feel safe at home. This information is not intended to replace advice given to you by your health care provider. Make sure you discuss any questions you have with your health care provider. Document Released: 10/23/2010 Document Revised: 09/15/2015 Document Reviewed: 01/11/2015  2017 Elsevier

## 2016-05-21 ENCOUNTER — Encounter: Payer: Self-pay | Admitting: Internal Medicine

## 2016-05-21 ENCOUNTER — Ambulatory Visit (INDEPENDENT_AMBULATORY_CARE_PROVIDER_SITE_OTHER): Payer: Medicare Other | Admitting: Internal Medicine

## 2016-05-21 VITALS — BP 128/80 | HR 84 | Temp 97.8°F | Resp 16 | Ht 66.0 in | Wt 187.0 lb

## 2016-05-21 DIAGNOSIS — K219 Gastro-esophageal reflux disease without esophagitis: Secondary | ICD-10-CM | POA: Diagnosis not present

## 2016-05-21 DIAGNOSIS — G43809 Other migraine, not intractable, without status migrainosus: Secondary | ICD-10-CM

## 2016-05-21 DIAGNOSIS — Z0000 Encounter for general adult medical examination without abnormal findings: Secondary | ICD-10-CM | POA: Diagnosis not present

## 2016-05-21 DIAGNOSIS — F419 Anxiety disorder, unspecified: Secondary | ICD-10-CM

## 2016-05-21 DIAGNOSIS — N289 Disorder of kidney and ureter, unspecified: Secondary | ICD-10-CM

## 2016-05-21 DIAGNOSIS — M85862 Other specified disorders of bone density and structure, left lower leg: Secondary | ICD-10-CM

## 2016-05-21 DIAGNOSIS — E7800 Pure hypercholesterolemia, unspecified: Secondary | ICD-10-CM

## 2016-05-21 DIAGNOSIS — F329 Major depressive disorder, single episode, unspecified: Secondary | ICD-10-CM

## 2016-05-21 DIAGNOSIS — F988 Other specified behavioral and emotional disorders with onset usually occurring in childhood and adolescence: Secondary | ICD-10-CM

## 2016-05-21 DIAGNOSIS — D751 Secondary polycythemia: Secondary | ICD-10-CM

## 2016-05-21 DIAGNOSIS — F32A Depression, unspecified: Secondary | ICD-10-CM

## 2016-05-21 DIAGNOSIS — M85861 Other specified disorders of bone density and structure, right lower leg: Secondary | ICD-10-CM | POA: Diagnosis not present

## 2016-05-21 DIAGNOSIS — N3281 Overactive bladder: Secondary | ICD-10-CM

## 2016-05-21 DIAGNOSIS — I6350 Cerebral infarction due to unspecified occlusion or stenosis of unspecified cerebral artery: Secondary | ICD-10-CM

## 2016-05-21 DIAGNOSIS — D6861 Antiphospholipid syndrome: Secondary | ICD-10-CM

## 2016-05-21 DIAGNOSIS — R739 Hyperglycemia, unspecified: Secondary | ICD-10-CM

## 2016-05-21 MED ORDER — PROPYLENE GLYCOL-GLYCERIN 0.6-0.6 % OP SOLN: 1.0000 [drp] | Freq: Every day | OPHTHALMIC | Status: DC

## 2016-05-21 MED ORDER — CALCIUM CARBONATE 600 MG PO TABS: 600.0000 mg | ORAL_TABLET | Freq: Every day | ORAL | Status: DC

## 2016-05-21 MED ORDER — VITAMIN D3 50 MCG (2000 UT) PO CAPS: 2000.0000 [IU] | ORAL_CAPSULE | Freq: Every day | ORAL | Status: AC

## 2016-05-21 NOTE — Assessment & Plan Note (Signed)
Resolved after stroke

## 2016-05-21 NOTE — Assessment & Plan Note (Signed)
Per psychiatry 

## 2016-05-21 NOTE — Assessment & Plan Note (Signed)
Lab Results  Component Value Date   HGBA1C 5.6 01/20/2016    Will monitor

## 2016-05-21 NOTE — Assessment & Plan Note (Signed)
[facility]

## 2016-05-21 NOTE — Assessment & Plan Note (Signed)
Mild Will check cmp at next visit

## 2016-05-21 NOTE — Assessment & Plan Note (Signed)
Taking warfarin BP well controlled

## 2016-05-21 NOTE — Assessment & Plan Note (Signed)
Lipid panel well controlled Continue daily statin Regular exercise and healthy diet encouraged

## 2016-05-21 NOTE — Assessment & Plan Note (Signed)
Following with urology Taking myrbetriq 

## 2016-05-21 NOTE — Assessment & Plan Note (Addendum)
aciphex prn only Overall controlled

## 2016-05-21 NOTE — Assessment & Plan Note (Addendum)
dexa done 12/2015 Taking calcium 600 mg daily and vitamin d regularly Will start exercising

## 2016-05-21 NOTE — Assessment & Plan Note (Signed)
Following with hematology Taking warfarin

## 2016-05-21 NOTE — Progress Notes (Signed)
Pre visit review using our clinic review tool, if applicable. No additional management support is needed unless otherwise documented below in the visit note. 

## 2016-05-28 ENCOUNTER — Other Ambulatory Visit: Payer: Self-pay | Admitting: Emergency Medicine

## 2016-05-28 ENCOUNTER — Encounter: Payer: Self-pay | Admitting: Internal Medicine

## 2016-05-28 ENCOUNTER — Other Ambulatory Visit: Payer: Self-pay | Admitting: Internal Medicine

## 2016-05-28 MED ORDER — RABEPRAZOLE SODIUM 20 MG PO TBEC: 20.0000 mg | DELAYED_RELEASE_TABLET | Freq: Every day | ORAL | 2 refills | Status: DC | PRN

## 2016-06-06 ENCOUNTER — Other Ambulatory Visit: Payer: Self-pay | Admitting: Internal Medicine

## 2016-07-19 ENCOUNTER — Ambulatory Visit (INDEPENDENT_AMBULATORY_CARE_PROVIDER_SITE_OTHER): Payer: Medicare Other | Admitting: Adult Health

## 2016-07-19 ENCOUNTER — Encounter: Payer: Self-pay | Admitting: Adult Health

## 2016-07-19 VITALS — BP 128/80 | HR 108 | Temp 98.4°F | Resp 18 | Wt 189.4 lb

## 2016-07-19 DIAGNOSIS — H6501 Acute serous otitis media, right ear: Secondary | ICD-10-CM | POA: Diagnosis not present

## 2016-07-19 DIAGNOSIS — R040 Epistaxis: Secondary | ICD-10-CM

## 2016-07-19 NOTE — Progress Notes (Signed)
Subjective:    Patient ID: Arther AbbottKaren S Birkhead, female    DOB: 11/21/1945, 71 y.o.   MRN: [id number]  Otalgia   There is pain in the right ear. This is a new problem. The current episode started today. There has been no fever. Pertinent negatives include no ear discharge or rhinorrhea. There is no history of a chronic ear infection, hearing loss or a tympanostomy tube.   Also reports nose bleeds intermittently since starting Dymista. She has been using normal saline spray but continues to have periodic nose bleeds. She is on coumadin as well    Review of Systems  Constitutional: Negative.   HENT: Positive for ear pain (right ) and nosebleeds. Negative for ear discharge, postnasal drip, rhinorrhea, sinus pain and sinus pressure.   Respiratory: Negative.   Cardiovascular: Negative.   Neurological: Negative.    Past Medical History:  Diagnosis Date  . Antiphospholipid antibody syndrome (HCC)   . Hypercholesteremia   . Migraines   . Polycythemia   . Stroke [facility](HCC) [id number]    Social History   Social History  . Marital status: Widowed    Spouse name: N/A  . Number of children: N/A  . Years of education: N/A   Occupational History  . Not on file.   Social History Main Topics  . Smoking status: Never Smoker  . Smokeless tobacco: Never Used  . Alcohol use Yes  . Drug use: No  . Sexual activity: Not on file   Other Topics Concern  . Not on file   Social History Narrative   Not exercising regularly    Past Surgical History:  Procedure Laterality Date  . ABDOMINAL HYSTERECTOMY    . NASAL SEPTUM SURGERY      Family History  Problem Relation Age of Onset  . Hypertension Mother   . Dementia Mother   . Hearing loss Mother   . Heart disease Father   . Hyperlipidemia Father   . Miscarriages / Stillbirths Maternal Grandfather   . Non-Hodgkin's lymphoma Brother     No Known Allergies  Current Outpatient Prescriptions on File Prior to Visit  Medication Sig Dispense Refill    . atomoxetine (STRATTERA) 100 MG capsule Take 100 mg by mouth daily.    . Azelastine-Fluticasone (DYMISTA) 137-50 MCG/ACT SUSP Place into the nose as needed.    . calcium carbonate (CALCIUM 600) 600 MG TABS tablet Take 1 tablet (600 mg total) by mouth daily with breakfast. 60 tablet   . Cholecalciferol (VITAMIN D3) 2000 units capsule Take 1 capsule (2,000 Units total) by mouth daily.    . cycloSPORINE (RESTASIS) 0.05 % ophthalmic emulsion 1 drop 2 (two) times daily.    Marland Kitchen. LORazepam (ATIVAN) 0.5 MG tablet Take 0.5 mg by mouth as needed for anxiety.    . mirabegron ER (MYRBETRIQ) 50 MG TB24 tablet Take 50 mg by mouth daily.    Marland Kitchen. Propylene Glycol-Glycerin (SOOTHE) 0.6-0.6 % SOLN Apply 1 drop to eye daily.    . RABEprazole (ACIPHEX) 20 MG tablet Take 1 tablet (20 mg total) by mouth daily as needed. 30 tablet 2  . sertraline (ZOLOFT) 50 MG tablet     . simvastatin (ZOCOR) 80 MG tablet TAKE 1 TABLET (80 MG TOTAL) BY MOUTH DAILY. 90 tablet 3  . traZODone (DESYREL) 50 MG tablet TAKE 1 TABLET BY MOUTH AT BEDTIME AS NEEDED 30 tablet 5  . warfarin (COUMADIN) 5 MG tablet Take 5 mg by mouth as directed. INR should be 2.5-3.5  No current facility-administered medications on file prior to visit.     BP 128/80 (BP Location: Left Arm, Patient Position: Sitting)   Pulse (!) 108   Temp 98.4 F (36.9 C)   Resp 18   Wt 189 lb 6.4 oz (85.9 kg)   SpO2 98%   BMI 30.57 kg/m       Objective:   Physical Exam  Constitutional: She is oriented to person, place, and time. She appears well-developed and well-nourished. No distress.  HENT:  Right Ear: Hearing, external ear and ear canal normal. Tympanic membrane is bulging. Tympanic membrane is not erythematous.  Left Ear: Hearing, external ear and ear canal normal. Tympanic membrane is not erythematous and not bulging.  Nose: Nose normal. No mucosal edema, rhinorrhea, nose lacerations or sinus tenderness. Right sinus exhibits no maxillary sinus tenderness and  no frontal sinus tenderness. Left sinus exhibits no maxillary sinus tenderness and no frontal sinus tenderness.  Mouth/Throat: Uvula is midline, oropharynx is clear and moist and mucous membranes are normal.  No nasal bleeding noted. No lacerations or dried blood in nasal canal   Cardiovascular: Normal rate, regular rhythm, normal heart sounds and intact distal pulses.  Exam reveals no gallop and no friction rub.   No murmur heard. Pulmonary/Chest: Effort normal and breath sounds normal. No respiratory distress. She has no wheezes. She has no rales. She exhibits no tenderness.  Neurological: She is alert and oriented to person, place, and time.  Skin: Skin is warm and dry. No rash noted. She is not diaphoretic. No erythema. No pallor.  Psychiatric: She has a normal mood and affect. Her behavior is normal. Judgment and thought content normal.  Nursing note and vitals reviewed.     Assessment & Plan:  1. Right acute serous otitis media, recurrence not specified - She can use Sudafed for the next 2-3 days  - Follow up if no improvement in the next 2-3 days   2. Epistaxis - Advised to get normal saline gel and use between Dymista dose  - Follow up with PCP if needed   Shirline Frees, NP

## 2016-07-19 NOTE — Progress Notes (Signed)
Pre visit review using our clinic review tool, if applicable. No additional management support is needed unless otherwise documented below in the visit note. 

## 2016-07-21 ENCOUNTER — Other Ambulatory Visit: Payer: Self-pay | Admitting: Internal Medicine

## 2016-07-23 ENCOUNTER — Telehealth: Payer: Self-pay | Admitting: *Deleted

## 2016-07-23 MED ORDER — SIMVASTATIN 80 MG PO TABS
80.0000 mg | ORAL_TABLET | Freq: Every day | ORAL | 2 refills | Status: DC
Start: 2016-07-23 — End: 2017-09-03

## 2016-07-23 NOTE — Telephone Encounter (Signed)
Rec'd call pt states she is not sure why her Simvastatin was denied. Per chart assistant states rx was sent back on Feb 18th, pt states she never got script. Inform pt  will resend to [facility].../[facility]

## 2016-10-12 ENCOUNTER — Other Ambulatory Visit: Payer: Self-pay | Admitting: Internal Medicine

## 2016-10-26 ENCOUNTER — Encounter: Payer: Self-pay | Admitting: Internal Medicine

## 2016-10-26 ENCOUNTER — Ambulatory Visit (INDEPENDENT_AMBULATORY_CARE_PROVIDER_SITE_OTHER): Payer: Medicare Other | Admitting: Internal Medicine

## 2016-10-26 VITALS — BP 130/86 | Temp 97.8°F | Wt 195.0 lb

## 2016-10-26 DIAGNOSIS — S1096XA Insect bite of unspecified part of neck, initial encounter: Secondary | ICD-10-CM

## 2016-10-26 DIAGNOSIS — W57XXXA Bitten or stung by nonvenomous insect and other nonvenomous arthropods, initial encounter: Secondary | ICD-10-CM | POA: Diagnosis not present

## 2016-10-26 DIAGNOSIS — B9689 Other specified bacterial agents as the cause of diseases classified elsewhere: Secondary | ICD-10-CM | POA: Diagnosis not present

## 2016-10-26 DIAGNOSIS — D6861 Antiphospholipid syndrome: Secondary | ICD-10-CM

## 2016-10-26 DIAGNOSIS — Z7901 Long term (current) use of anticoagulants: Secondary | ICD-10-CM | POA: Diagnosis not present

## 2016-10-26 DIAGNOSIS — L089 Local infection of the skin and subcutaneous tissue, unspecified: Secondary | ICD-10-CM

## 2016-10-26 LAB — POCT INR: INR: 2.8

## 2016-10-26 MED ORDER — DOXYCYCLINE HYCLATE 100 MG PO TABS: 100.0000 mg | ORAL_TABLET | Freq: Two times a day (BID) | ORAL | 0 refills | Status: DC

## 2016-10-26 NOTE — Patient Instructions (Signed)
This could be and infection bite or just a bite  And a large local reaction .   But signs of infection .    Also so added . Antibiotic   And  Local  Care   [facility] your  [facility] about poss adjustment of anticoagulant dose temporarily .      Insect Bite, Adult An insect bite can make your skin red, itchy, and swollen. An insect bite is different from an insect sting, which happens when an insect injects poison (venom) into the skin. Some insects can spread disease to people through a bite. However, most insect bites do not lead to disease and are not serious. What are the causes? Insects may bite for a variety of reasons, including:  Hunger.  To defend themselves.  Insects that bite include:  Spiders.  Mosquitoes.  Ticks.  Fleas.  Ants.  Flies.  Bedbugs.  What are the signs or symptoms? Symptoms of this condition include:  Itching or pain in the bite area.  Redness and swelling in the bite area.  An open wound (skin ulcer).  In many cases, symptoms last for 2-4 days. How is this diagnosed? This condition is usually diagnosed based on symptoms and a physical exam. How is this treated? Treatment is usually not needed. Symptoms often go away on their own. When treatment is recommended, it may involve:  Applying a cream or lotion to the bitten area. This treatment helps with itching.  Taking an antibiotic medicine. This treatment is needed if the bite area gets infected.  Getting a tetanus shot.  Applying ice to the affected area.  Medicines called antihistamines. This treatment is needed if you develop an allergic reaction to the insect bite.  Follow these instructions at home: Bite area care  Do not scratch the bite area.  Keep the bite area clean and dry. Wash it every day with soap and water as told by your health care provider.  Check the bite area every day for signs of infection. Check for: ? More redness, swelling, or pain. ? Fluid or  blood. ? Warmth. ? Pus. Managing pain, itching, and swelling   You may apply a baking soda paste, cortisone cream, or calamine lotion to the bite area as told by your health care provider.  If directed, applyice to the bite area. ? Put ice in a plastic bag. ? Place a towel between your skin and the bag. ? Leave the ice on for 20 minutes, 2-3 times per day. Medicines  Apply or take over-the-counter and prescription medicines only as told by your health care provider.  If you were prescribed an antibiotic medicine, use it as told by your health care provider. Do not stop using the antibiotic even if your condition improves. General instructions  Keep all follow-up visits as told by your health care provider. This is important. How is this prevented? To help reduce your risk of insect bites:  When you are outdoors, wear clothing that covers your arms and legs.  Use insect repellent. The best insect repellents contain: ? DEET, picaridin, oil of lemon eucalyptus (OLE), or IR3535. ? Higher amounts of an active ingredient.  If your home windows do not have screens, consider installing them.  Contact a health care provider if:  You have more redness, swelling, or pain in the bite area.  You have fluid, blood, or pus coming from the bite area.  The bite area feels warm to the touch.  You have a  fever. Get help right away if:  You have joint pain.  You have a rash.  You have shortness of breath.  You feel unusually tired or sleepy.  You have neck pain.  You have a headache.  You have unusual weakness.  You have chest pain.  You have nausea, vomiting, or pain in the abdomen. This information is not intended to replace advice given to you by your health care provider. Make sure you discuss any questions you have with your health care provider. Document Released: 05/17/2004 Document Revised: 12/07/2015 Document Reviewed: 10/17/2015 Elsevier Interactive Patient  Education  [employer].

## 2016-10-26 NOTE — Progress Notes (Signed)
Chief Complaint  Patient presents with  . Insect Bite    HPI: Kathleen Lin 71 y.o. SDA PCP [place] S[facility] .  New problem   Planting   Outside .2 days ago   Next day had pink spot    Left upper chest    Saw derm PA or assistant   In  dr Terri Piedra   Office and told  Spider bite   At derm  Given topical steroid touse .  todayu in increasing redness and it was sore and stiff to turn her neck to the right. There is no discharge or trauma. She is very worried about possibly being something more serious. Not a lot of itching at this point. No fever.  Hx of cva  apla   Life long anticoagulation   2.5 - 3. 5 goal followed at [facility] when she gets put on antibiotics she goes to do to recheck her INR. Her last INR was 2.4. This was on June 22. There is no active bleeding.  ROS: See pertinent positives and negatives per HPI.  Past Medical History:  Diagnosis Date  . Antiphospholipid antibody syndrome (HCC)   . Hypercholesteremia   . Migraines   . Polycythemia   . Stroke [facility]) [id number]    Family History  Problem Relation Age of Onset  . Hypertension Mother   . Dementia Mother   . Hearing loss Mother   . Heart disease Father   . Hyperlipidemia Father   . Miscarriages / Stillbirths Maternal Grandfather   . Non-Hodgkin's lymphoma Brother     Social History   Social History  . Marital status: Widowed    Spouse name: N/A  . Number of children: N/A  . Years of education: N/A   Social History Main Topics  . Smoking status: Never Smoker  . Smokeless tobacco: Never Used  . Alcohol use Yes  . Drug use: No  . Sexual activity: Not Asked   Other Topics Concern  . None   Social History Narrative   Not exercising regularly    Outpatient Medications Prior to Visit  Medication Sig Dispense Refill  . atomoxetine (STRATTERA) 100 MG capsule Take 100 mg by mouth daily.    . Azelastine-Fluticasone (DYMISTA) 137-50 MCG/ACT SUSP Place into the nose as needed.    . Cholecalciferol (VITAMIN D3)  2000 units capsule Take 1 capsule (2,000 Units total) by mouth daily.    . cycloSPORINE (RESTASIS) 0.05 % ophthalmic emulsion 1 drop 2 (two) times daily.    Marland Kitchen LORazepam (ATIVAN) 0.5 MG tablet Take 0.5 mg by mouth as needed for anxiety.    . mirabegron ER (MYRBETRIQ) 50 MG TB24 tablet Take 50 mg by mouth daily.    Marland Kitchen Propylene Glycol-Glycerin (SOOTHE) 0.6-0.6 % SOLN Apply 1 drop to eye daily.    . RABEprazole (ACIPHEX) 20 MG tablet Take 1 tablet (20 mg total) by mouth daily as needed. 30 tablet 2  . sertraline (ZOLOFT) 50 MG tablet     . simvastatin (ZOCOR) 80 MG tablet Take 1 tablet (80 mg total) by mouth daily. 90 tablet 2  . traZODone (DESYREL) 50 MG tablet TAKE 1 TABLET BY MOUTH AT BEDTIME AS NEEDED 30 tablet 1  . warfarin (COUMADIN) 5 MG tablet Take 5 mg by mouth as directed. INR should be 2.5-3.5    . calcium carbonate (CALCIUM 600) 600 MG TABS tablet Take 1 tablet (600 mg total) by mouth daily with breakfast. (Patient not taking: Reported on 10/26/2016) 60 tablet   .  fluticasone (VERAMYST) 27.5 MCG/SPRAY nasal spray Place 2 sprays into the nose daily.     No facility-administered medications prior to visit.      EXAM:  BP 130/86 (BP Location: Left Arm)   Temp 97.8 F (36.6 C) (Oral)   Wt 195 lb (88.5 kg)   BMI 31.47 kg/m   Body mass index is 31.47 kg/m.  GENERAL: vitals reviewed and listed above, alert, oriented, appears well hydrated and in no acute distressThere is some obvious redness at the base anteriorly of her left neck near the clavicle. With some erythema flat and upper chest. There is a pinpoint center and induration of about 8 mm to 1 cm. It is minimally tender nonfluctuant. There is no adenopathy obvious in the supraclavicular area there is no bleeding or bruising. HEENT: atraumatic, conjunctiva  clear, no obvious abnormalities on inspection of external nose and ears OP : no lesion edema or exudate  NECK: no obvious masses on inspection palpation  MS: moves all  extremities without noticeable focal  abnormality PSYCH: pleasant and cooperative,  mildly anxious  Lab Results  Component Value Date   INR 2.8 10/26/2016    ASSESSMENT AND PLAN:  Discussed the following assessment and plan:  Infected insect bite of neck, initial encounter - Plan: POCT INR  Localized bacterial skin infection ?  Anticoagulant long-term use - Plan: POCT INR  Antiphospholipid syndrome (HCC) Discussed differential diagnosis it could be an insect bite with large local reaction in which case will run its course however because of the swelling around it will start empirical antibiotic because we are going into a weekend.  Could also  be an infected cyst  If get systemic symptoms contact medical team. Check INR today at our point-of-care as baseline before antibiotic which can raise the INR potentially 1 week of antibiotic given she should contact you about any change in plans of her anticoagulation. -Patient advised to return or notify health care team  if symptoms worsen ,persist or new concerns arise. Total visit 25mins > 50% spent counseling and coordinating care as indicated in above note and in instructions to patient .   Patient Instructions  This could be and infection bite or just a bite  And a large local reaction .   But signs of infection .    Also so added . Antibiotic   And  Local  Care   Contact your  People at [facility] about poss adjustment of anticoagulant dose temporarily .      Insect Bite, Adult An insect bite can make your skin red, itchy, and swollen. An insect bite is different from an insect sting, which happens when an insect injects poison (venom) into the skin. Some insects can spread disease to people through a bite. However, most insect bites do not lead to disease and are not serious. What are the causes? Insects may bite for a variety of reasons, including:  Hunger.  To defend themselves.  Insects that bite  include:  Spiders.  Mosquitoes.  Ticks.  Fleas.  Ants.  Flies.  Bedbugs.  What are the signs or symptoms? Symptoms of this condition include:  Itching or pain in the bite area.  Redness and swelling in the bite area.  An open wound (skin ulcer).  In many cases, symptoms last for 2-4 days. How is this diagnosed? This condition is usually diagnosed based on symptoms and a physical exam. How is this treated? Treatment is usually not needed. Symptoms often go away on  their own. When treatment is recommended, it may involve:  Applying a cream or lotion to the bitten area. This treatment helps with itching.  Taking an antibiotic medicine. This treatment is needed if the bite area gets infected.  Getting a tetanus shot.  Applying ice to the affected area.  Medicines called antihistamines. This treatment is needed if you develop an allergic reaction to the insect bite.  Follow these instructions at home: Bite area care  Do not scratch the bite area.  Keep the bite area clean and dry. Wash it every day with soap and water as told by your health care provider.  Check the bite area every day for signs of infection. Check for: ? More redness, swelling, or pain. ? Fluid or blood. ? Warmth. ? Pus. Managing pain, itching, and swelling   You may apply a baking soda paste, cortisone cream, or calamine lotion to the bite area as told by your health care provider.  If directed, applyice to the bite area. ? Put ice in a plastic bag. ? Place a towel between your skin and the bag. ? Leave the ice on for 20 minutes, 2-3 times per day. Medicines  Apply or take over-the-counter and prescription medicines only as told by your health care provider.  If you were prescribed an antibiotic medicine, use it as told by your health care provider. Do not stop using the antibiotic even if your condition improves. General instructions  Keep all follow-up visits as told by your health  care provider. This is important. How is this prevented? To help reduce your risk of insect bites:  When you are outdoors, wear clothing that covers your arms and legs.  Use insect repellent. The best insect repellents contain: ? DEET, picaridin, oil of lemon eucalyptus (OLE), or IR3535. ? Higher amounts of an active ingredient.  If your home windows do not have screens, consider installing them.  Contact a health care provider if:  You have more redness, swelling, or pain in the bite area.  You have fluid, blood, or pus coming from the bite area.  The bite area feels warm to the touch.  You have a fever. Get help right away if:  You have joint pain.  You have a rash.  You have shortness of breath.  You feel unusually tired or sleepy.  You have neck pain.  You have a headache.  You have unusual weakness.  You have chest pain.  You have nausea, vomiting, or pain in the abdomen. This information is not intended to replace advice given to you by your health care provider. Make sure you discuss any questions you have with your health care provider. Document Released: 05/17/2004 Document Revised: 12/07/2015 Document Reviewed: 10/17/2015 Elsevier Interactive Patient Education  2018 [employer].     [place]. Panosh M.D.

## 2016-11-17 NOTE — Progress Notes (Signed)
Subjective:    Patient ID: Kathleen Lin, female    DOB: 10/04/1945, 71 y.o.   MRN: [id number]  HPI The patient is here for follow up.  Hyperlipidemia: She is taking her medication daily. She is not always compliant with a low fat/cholesterol diet. She is not exercising regularly. She denies myalgias.   Hyperglycemia:  She is trying to work on weight loss.  She is not currently exercising.    Insomnia:  She takes trazodone nightly.  She sleeps well.  She feels the medication dose is good.   GERD:  She is taking her medication as needed only.  She has GERD symptoms occasionally only.    Medications and allergies reviewed with patient and updated if appropriate.  Patient Active Problem List   Diagnosis Date Noted  . Osteopenia 01/19/2016  . Renal insufficiency 12/20/2015  . Hyperglycemia 12/20/2015  . Elevated blood pressure (not hypertension) 12/20/2015  . Anxiety 05/21/2015  . ADD (attention deficit disorder) 05/21/2015  . Overactive bladder 05/20/2015  . Long term current use of anticoagulant 07/02/2012  . Antiphospholipid syndrome (HCC) 11/07/2011  . Hypercholesterolemia 11/07/2011  . Headache, migraine 11/07/2011  . Polycythemia 11/07/2011  . Depression 07/18/2007  . Cerebral artery occlusion with cerebral infarction (HCC) 07/18/2007  . GERD 07/18/2007  . HEMORRHOIDS, HX OF 07/18/2007    Current Outpatient Prescriptions on File Prior to Visit  Medication Sig Dispense Refill  . atomoxetine (STRATTERA) 100 MG capsule Take 100 mg by mouth daily.    . Azelastine-Fluticasone (DYMISTA) 137-50 MCG/ACT SUSP Place into the nose as needed.    . calcium carbonate (CALCIUM 600) 600 MG TABS tablet Take 1 tablet (600 mg total) by mouth daily with breakfast. 60 tablet   . Cholecalciferol (VITAMIN D3) 2000 units capsule Take 1 capsule (2,000 Units total) by mouth daily.    . cycloSPORINE (RESTASIS) 0.05 % ophthalmic emulsion 1 drop 2 (two) times daily.    Marland Kitchen. LORazepam (ATIVAN) 0.5  MG tablet Take 0.5 mg by mouth as needed for anxiety.    . mirabegron ER (MYRBETRIQ) 50 MG TB24 tablet Take 50 mg by mouth daily.    Marland Kitchen. Propylene Glycol-Glycerin (SOOTHE) 0.6-0.6 % SOLN Apply 1 drop to eye daily.    . RABEprazole (ACIPHEX) 20 MG tablet Take 1 tablet (20 mg total) by mouth daily as needed. 30 tablet 2  . sertraline (ZOLOFT) 50 MG tablet     . simvastatin (ZOCOR) 80 MG tablet Take 1 tablet (80 mg total) by mouth daily. 90 tablet 2  . traZODone (DESYREL) 50 MG tablet TAKE 1 TABLET BY MOUTH AT BEDTIME AS NEEDED 30 tablet 1  . triamcinolone ointment (KENALOG) 0.1 % Apply twice daily    . warfarin (COUMADIN) 5 MG tablet Take 5 mg by mouth as directed. INR should be 2.5-3.5     No current facility-administered medications on file prior to visit.     Past Medical History:  Diagnosis Date  . Antiphospholipid antibody syndrome (HCC)   . Hypercholesteremia   . Migraines   . Polycythemia   . Stroke [facility](HCC) [id number]) [id number]    Past Surgical History:  Procedure Laterality Date  . ABDOMINAL HYSTERECTOMY    . NASAL SEPTUM SURGERY      Social History   Social History  . Marital status: Widowed    Spouse name: N/A  . Number of children: N/A  . Years of education: N/A   Social History Main Topics  . Smoking status: Never Smoker  .  Smokeless tobacco: Never Used  . Alcohol use Yes  . Drug use: No  . Sexual activity: Not Asked   Other Topics Concern  . None   Social History Narrative   Not exercising regularly    Family History  Problem Relation Age of Onset  . Hypertension Mother   . Dementia Mother   . Hearing loss Mother   . Heart disease Father   . Hyperlipidemia Father   . Miscarriages / Stillbirths Maternal Grandfather   . Non-Hodgkin's lymphoma Brother     Review of Systems  Constitutional: Negative for chills and fever.  Eyes: Negative for visual disturbance.  Respiratory: Negative for cough, shortness of breath and wheezing.   Cardiovascular: Negative for  chest pain, palpitations and leg swelling.  Musculoskeletal: Negative for myalgias.  Neurological: Negative for dizziness, light-headedness and headaches.       Objective:   Vitals:   11/19/16 0938  BP: 128/80  Pulse: 83  Resp: 16  Temp: 97.7 F (36.5 C)   Wt Readings from Last 3 Encounters:  11/19/16 194 lb (88 kg)  10/26/16 195 lb (88.5 kg)  07/19/16 189 lb 6.4 oz (85.9 kg)   Body mass index is 31.31 kg/m.   Physical Exam    Constitutional: Appears well-developed and well-nourished. No distress.  HENT:  Head: Normocephalic and atraumatic.  Neck: Neck supple. No tracheal deviation present. No thyromegaly present.  No cervical lymphadenopathy Cardiovascular: Normal rate, regular rhythm and normal heart sounds.   No murmur heard. No carotid bruit .  No edema Pulmonary/Chest: Effort normal and breath sounds normal. No respiratory distress. No has no wheezes. No rales.  Skin: Skin is warm and dry. Not diaphoretic.  Psychiatric: Normal mood and affect. Behavior is normal.      Assessment & Plan:    See Problem List for Assessment and Plan of chronic medical problems.

## 2016-11-19 ENCOUNTER — Ambulatory Visit (INDEPENDENT_AMBULATORY_CARE_PROVIDER_SITE_OTHER): Payer: Medicare Other | Admitting: Internal Medicine

## 2016-11-19 ENCOUNTER — Other Ambulatory Visit: Payer: Medicare Other

## 2016-11-19 ENCOUNTER — Encounter: Payer: Self-pay | Admitting: Internal Medicine

## 2016-11-19 VITALS — BP 128/80 | HR 83 | Temp 97.7°F | Resp 16 | Ht 66.0 in | Wt 194.0 lb

## 2016-11-19 DIAGNOSIS — G4700 Insomnia, unspecified: Secondary | ICD-10-CM | POA: Insufficient documentation

## 2016-11-19 DIAGNOSIS — K219 Gastro-esophageal reflux disease without esophagitis: Secondary | ICD-10-CM

## 2016-11-19 DIAGNOSIS — G4709 Other insomnia: Secondary | ICD-10-CM | POA: Diagnosis not present

## 2016-11-19 DIAGNOSIS — F329 Major depressive disorder, single episode, unspecified: Secondary | ICD-10-CM | POA: Diagnosis not present

## 2016-11-19 DIAGNOSIS — R739 Hyperglycemia, unspecified: Secondary | ICD-10-CM | POA: Diagnosis not present

## 2016-11-19 DIAGNOSIS — D6861 Antiphospholipid syndrome: Secondary | ICD-10-CM

## 2016-11-19 DIAGNOSIS — E7800 Pure hypercholesterolemia, unspecified: Secondary | ICD-10-CM | POA: Diagnosis not present

## 2016-11-19 DIAGNOSIS — F32A Depression, unspecified: Secondary | ICD-10-CM

## 2016-11-19 NOTE — Assessment & Plan Note (Signed)
Following with psychiatry - management per him

## 2016-11-19 NOTE — Patient Instructions (Signed)
  Test(s) ordered today. Your results will be released to MyChart (or called to you) after review, usually within 72hours after test completion. If any changes need to be made, you will be notified at that same time.  No immunizations administered today.   Medications reviewed and updated.  No changes recommended at this time.  Please followup in 6 months   

## 2016-11-19 NOTE — Assessment & Plan Note (Signed)
Taking aciphex as needed only

## 2016-11-19 NOTE — Assessment & Plan Note (Signed)
encouraged regular exercise Dec portions -work on weight loss Will check a1c

## 2016-11-19 NOTE — Assessment & Plan Note (Signed)
[facility]

## 2016-11-19 NOTE — Assessment & Plan Note (Signed)
Taking trazodone nightly which works well Continue at current dose

## 2016-11-19 NOTE — Assessment & Plan Note (Signed)
Check lipid panel  Continue daily statin Regular exercise and healthy diet encouraged  

## 2016-11-20 ENCOUNTER — Encounter: Payer: Self-pay | Admitting: Internal Medicine

## 2016-11-29 ENCOUNTER — Other Ambulatory Visit (INDEPENDENT_AMBULATORY_CARE_PROVIDER_SITE_OTHER): Payer: Medicare Other

## 2016-11-29 DIAGNOSIS — R739 Hyperglycemia, unspecified: Secondary | ICD-10-CM

## 2016-11-29 DIAGNOSIS — E7800 Pure hypercholesterolemia, unspecified: Secondary | ICD-10-CM

## 2016-11-29 LAB — LIPID PANEL
CHOL/HDL RATIO: 4
Cholesterol: 121 mg/dL (ref 0–200)
HDL: 29.9 mg/dL — AB (ref >39.00)
LDL Cholesterol: 75 mg/dL (ref 0–99)
NonHDL: 91.25
TRIGLYCERIDES: 80 mg/dL (ref 0.0–149.0)
VLDL: 16 mg/dL (ref 0.0–40.0)

## 2016-11-29 LAB — TSH: TSH: 2.25 u[IU]/mL (ref 0.35–4.50)

## 2016-11-29 LAB — COMPREHENSIVE METABOLIC PANEL
ALK PHOS: 63 U/L (ref 39–117)
ALT: 11 U/L (ref 0–35)
AST: 17 U/L (ref 0–37)
Albumin: 4.1 g/dL (ref 3.5–5.2)
BILIRUBIN TOTAL: 0.7 mg/dL (ref 0.2–1.2)
BUN: 16 mg/dL (ref 6–23)
CALCIUM: 9 mg/dL (ref 8.4–10.5)
CO2: 30 mEq/L (ref 19–32)
Chloride: 106 mEq/L (ref 96–112)
Creatinine, Ser: 1.07 mg/dL (ref 0.40–1.20)
GFR: 54.07 mL/min — AB (ref >60.00)
Glucose, Bld: 109 mg/dL — ABNORMAL HIGH (ref 70–99)
Potassium: 4.5 mEq/L (ref 3.5–5.1)
Sodium: 141 mEq/L (ref 135–145)
TOTAL PROTEIN: 7.1 g/dL (ref 6.0–8.3)

## 2016-11-29 LAB — HEMOGLOBIN A1C: HEMOGLOBIN A1C: 5.5 % (ref 4.6–6.5)

## 2016-12-01 ENCOUNTER — Encounter: Payer: Self-pay | Admitting: Internal Medicine

## 2016-12-11 ENCOUNTER — Other Ambulatory Visit: Payer: Self-pay | Admitting: Internal Medicine

## 2016-12-11 NOTE — Telephone Encounter (Signed)
Last filled 6.22.18

## 2017-01-07 ENCOUNTER — Other Ambulatory Visit: Payer: Self-pay | Admitting: Emergency Medicine

## 2017-01-07 MED ORDER — TRAZODONE HCL 50 MG PO TABS: ORAL_TABLET | ORAL | 1 refills | Status: DC

## 2017-01-07 NOTE — Telephone Encounter (Signed)
Are you okay with filling 90 day supply?

## 2017-01-08 ENCOUNTER — Ambulatory Visit (INDEPENDENT_AMBULATORY_CARE_PROVIDER_SITE_OTHER): Payer: Medicare Other | Admitting: General Practice

## 2017-01-08 DIAGNOSIS — Z23 Encounter for immunization: Secondary | ICD-10-CM

## 2017-03-20 ENCOUNTER — Telehealth: Payer: Self-pay | Admitting: Internal Medicine

## 2017-03-20 MED ORDER — AZELASTINE-FLUTICASONE 137-50 MCG/ACT NA SUSP: NASAL | 8 refills | Status: DC

## 2017-03-20 NOTE — Telephone Encounter (Signed)
Cannot determine what provider ordered this. Sent back to office   CRM for notification. See Telephone encounter for:  Requesting refill Azelastine-Fluticasone (DYMISTA) 137-50 MCG/ACT, [facility] call [facility] [address] 03/20/17. Requesting call once sent

## 2017-03-20 NOTE — Telephone Encounter (Signed)
Medication request for Dr Lawerance BachBurns

## 2017-03-20 NOTE — Telephone Encounter (Signed)
Pls advise on msg below Dr.Burns.Marland Kitchen.Raechel Chute/lmb

## 2017-03-20 NOTE — Telephone Encounter (Signed)
Pt called back and said Dr. Suszanne Connerseoh prescribed it & she said she is fed up with the office over there bc they will not call it in. She would like Dr Lawerance BachBurns to please call her this in. She said her nose is bleeding when she blows it. [facility] [address].

## 2017-03-20 NOTE — Telephone Encounter (Signed)
Copied from CRM [phone]. Topic: Quick Communication - See Telephone Encounter >> Mar 20, 2017 10:04 AM Oneal GroutSebastian, Jennifer S wrote: CRM for notification. See Telephone encounter for:  Requesting refill Azelastine-Fluticasone (DYMISTA) 137-50 MCG/ACT, [facility] [address] 03/20/17. Requesting call once sent

## 2017-03-20 NOTE — Telephone Encounter (Signed)
rx sent

## 2017-03-21 NOTE — Telephone Encounter (Signed)
Notified pt [facility].../lmb 

## 2017-04-25 ENCOUNTER — Ambulatory Visit: Payer: Self-pay | Admitting: *Deleted

## 2017-04-25 NOTE — Telephone Encounter (Signed)
Pt reports bilateral leg cramps last night involving calves, lasting 15 minutes. No further cramping today.  No redness, swelling warmth to areas. No other symptoms. Did note walked a lot yesterday and may not have stayed hydrated. Home Care advised. Advised to stay hydrated (Gatorade)  Instructed to call back if any redness, warmth, swelling of calves or if cramping continues or worsens.   Reason for Disposition . [1] Caused by muscle cramps in the thigh, calf, or foot AND [2] present < 1 hour (brief, now gone)  Answer Assessment - Initial Assessment Questions 1. ONSET: "When did the pain start?"      "Last night" 2. LOCATION: "Where is the pain located?"      Both calves 3. PAIN: "How bad is the pain?"    (Scale 1-10; or mild, moderate, severe)   -  MILD (1-3): doesn't interfere with normal activities    -  MODERATE (4-7): interferes with normal activities (e.g., work or school) or awakens from sleep, limping    -  SEVERE (8-10): excruciating pain, unable to do any normal activities, unable to walk    8-9/10 4. WORK OR EXERCISE: "Has there been any recent work or exercise that involved this part of the body?"      "Walked a lot yesterday" 5. CAUSE: "What do you think is causing the leg pain?"     Unsure 6. OTHER SYMPTOMS: "Do you have any other symptoms?" (e.g., chest pain, back pain, breathing difficulty, swelling, rash, fever, numbness, weakness)     Heartburn, took Rabeprazole which helped  Protocols used: LEG PAIN-A-AH

## 2017-05-22 ENCOUNTER — Ambulatory Visit: Payer: Medicare Other | Admitting: Internal Medicine

## 2017-05-28 ENCOUNTER — Ambulatory Visit (INDEPENDENT_AMBULATORY_CARE_PROVIDER_SITE_OTHER): Payer: Medicare Other | Admitting: Internal Medicine

## 2017-05-28 ENCOUNTER — Encounter: Payer: Self-pay | Admitting: Internal Medicine

## 2017-05-28 VITALS — BP 128/86 | HR 76 | Temp 98.3°F | Resp 16 | Wt 190.0 lb

## 2017-05-28 DIAGNOSIS — G4709 Other insomnia: Secondary | ICD-10-CM | POA: Diagnosis not present

## 2017-05-28 DIAGNOSIS — E7800 Pure hypercholesterolemia, unspecified: Secondary | ICD-10-CM

## 2017-05-28 DIAGNOSIS — R739 Hyperglycemia, unspecified: Secondary | ICD-10-CM

## 2017-05-28 DIAGNOSIS — K219 Gastro-esophageal reflux disease without esophagitis: Secondary | ICD-10-CM | POA: Diagnosis not present

## 2017-05-28 NOTE — Assessment & Plan Note (Addendum)
Taking aciphex only as needed Overall controlled Continue as needed medication only

## 2017-05-28 NOTE — Assessment & Plan Note (Signed)
Taking trazodone 50 mg daily Well-controlled Continue current dose of medication

## 2017-05-28 NOTE — Progress Notes (Signed)
Subjective:    Patient ID: Kathleen Lin, female    DOB: February 02, 1946, 71 y.o.   MRN: [id number]  HPI The patient is here for follow up.  Hyperlipidemia: She is taking her medication daily. She is compliant with a low fat/cholesterol diet. She is not exercising regularly.  She has joined a gym and plans on starting to exercise.  She denies myalgias.   Insomnia:  She is taking trazodone nightly.  It works well and her sleep is good.   GERD:  She is taking her medication as needed only.  She denies any GERD symptoms and feels her GERD is well controlled.   Hyperglycemia: She has had elevated sugars in the past and would like to have a sugar recheck.  She is not always compliant with a low sugar/carbohydrate diet and is currently not exercising.  She is following with urology for her bladder.  Her medication was switched and she is happy with the oxybutynin.  She does have a dry mouth because of it, but it is tolerable.  Her INR has been stable and she is seeing her specialist on a regular basis for that.  She does have some increased stress.  Her mother has Alzheimer's disease and she is still living independently, but she is helping her out significantly.  Medications and allergies reviewed with patient and updated if appropriate.  Patient Active Problem List   Diagnosis Date Noted  . Insomnia 11/19/2016  . Osteopenia 01/19/2016  . Renal insufficiency 12/20/2015  . Hyperglycemia 12/20/2015  . Anxiety 05/21/2015  . ADD (attention deficit disorder) 05/21/2015  . Overactive bladder 05/20/2015  . Long term current use of anticoagulant 07/02/2012  . Antiphospholipid syndrome (HCC) 11/07/2011  . Hypercholesterolemia 11/07/2011  . Headache, migraine 11/07/2011  . Polycythemia 11/07/2011  . Depression 07/18/2007  . Cerebral artery occlusion with cerebral infarction (HCC) 07/18/2007  . GERD 07/18/2007  . HEMORRHOIDS, HX OF 07/18/2007    Current Outpatient Medications on File Prior  to Visit  Medication Sig Dispense Refill  . atomoxetine (STRATTERA) 100 MG capsule Take 100 mg by mouth daily.    . Azelastine-Fluticasone (DYMISTA) 137-50 MCG/ACT SUSP Place into the nose as needed. 23 g 8  . calcium carbonate (CALCIUM 600) 600 MG TABS tablet Take 1 tablet (600 mg total) by mouth daily with breakfast. 60 tablet   . Cholecalciferol (VITAMIN D3) 2000 units capsule Take 1 capsule (2,000 Units total) by mouth daily.    . cycloSPORINE (RESTASIS) 0.05 % ophthalmic emulsion 1 drop 2 (two) times daily.    Marland Kitchen LORazepam (ATIVAN) 0.5 MG tablet Take 0.5 mg by mouth as needed for anxiety.    . mirabegron ER (MYRBETRIQ) 50 MG TB24 tablet Take 50 mg by mouth daily.    Marland Kitchen Propylene Glycol-Glycerin (SOOTHE) 0.6-0.6 % SOLN Apply 1 drop to eye daily.    . RABEprazole (ACIPHEX) 20 MG tablet Take 1 tablet (20 mg total) by mouth daily as needed. 30 tablet 2  . sertraline (ZOLOFT) 50 MG tablet     . simvastatin (ZOCOR) 80 MG tablet Take 1 tablet (80 mg total) by mouth daily. 90 tablet 2  . traZODone (DESYREL) 50 MG tablet TAKE 1 TABLET BY MOUTH EVERY DAY AT BEDTIME AS NEEDED 90 tablet 1  . triamcinolone ointment (KENALOG) 0.1 % Apply twice daily    . warfarin (COUMADIN) 5 MG tablet Take 5 mg by mouth as directed. INR should be 2.5-3.5     No current facility-administered medications on  file prior to visit.     Past Medical History:  Diagnosis Date  . Antiphospholipid antibody syndrome (HCC)   . Hypercholesteremia   . Migraines   . Polycythemia   . Stroke [facility](HCC) [id number]) [id number]    Past Surgical History:  Procedure Laterality Date  . ABDOMINAL HYSTERECTOMY    . NASAL SEPTUM SURGERY      Social History   Socioeconomic History  . Marital status: Widowed    Spouse name: None  . Number of children: None  . Years of education: None  . Highest education level: None  Social Needs  . Financial resource strain: None  . Food insecurity - worry: None  . Food insecurity - inability: None  .  Transportation needs - medical: None  . Transportation needs - non-medical: None  Occupational History  . None  Tobacco Use  . Smoking status: Never Smoker  . Smokeless tobacco: Never Used  Substance and Sexual Activity  . Alcohol use: Yes  . Drug use: No  . Sexual activity: None  Other Topics Concern  . None  Social History Narrative   Not exercising regularly    Family History  Problem Relation Age of Onset  . Hypertension Mother   . Dementia Mother   . Hearing loss Mother   . Heart disease Father   . Hyperlipidemia Father   . Miscarriages / Stillbirths Maternal Grandfather   . Non-Hodgkin's lymphoma Brother     Review of Systems  Constitutional: Negative for chills and fever.  HENT: Positive for postnasal drip and voice change.   Respiratory: Negative for cough, shortness of breath and wheezing.   Cardiovascular: Negative for chest pain, palpitations and leg swelling.  Neurological: Negative for light-headedness and headaches.       Objective:   Vitals:   05/28/17 1605  BP: 128/86  Pulse: 76  Resp: 16  Temp: 98.3 F (36.8 C)  SpO2: 98%   Wt Readings from Last 3 Encounters:  05/28/17 190 lb (86.2 kg)  11/19/16 194 lb (88 kg)  10/26/16 195 lb (88.5 kg)   Body mass index is 30.67 kg/m.   Physical Exam    Constitutional: Appears well-developed and well-nourished. No distress.  HENT:  Head: Normocephalic and atraumatic.  Neck: Neck supple. No tracheal deviation present. No thyromegaly present.  No cervical lymphadenopathy Cardiovascular: Normal rate, regular rhythm and normal heart sounds.   No murmur heard. No carotid bruit .  No edema Pulmonary/Chest: Effort normal and breath sounds normal. No respiratory distress. No has no wheezes. No rales.  Skin: Skin is warm and dry. Not diaphoretic.  Psychiatric: Normal mood and affect. Behavior is normal.      Assessment & Plan:    See Problem List for Assessment and Plan of chronic medical problems.

## 2017-05-28 NOTE — Assessment & Plan Note (Signed)
History of elevated sugar Will check A1c Advised low sugar/carbohydrate diet Encourage starting regular exercise

## 2017-05-28 NOTE — Patient Instructions (Addendum)
  Test(s) ordered today. Your results will be released to MyChart (or called to you) after review, usually within 72hours after test completion. If any changes need to be made, you will be notified at that same time.  Medications reviewed and updated.  No changes recommended at this time.    Please followup in 6 months   

## 2017-05-28 NOTE — Assessment & Plan Note (Signed)
Check lipid panel  Continue daily statin Regular exercise and healthy diet encouraged  

## 2017-06-30 ENCOUNTER — Other Ambulatory Visit: Payer: Self-pay | Admitting: Internal Medicine

## 2017-07-13 ENCOUNTER — Other Ambulatory Visit: Payer: Self-pay | Admitting: Internal Medicine

## 2017-09-02 ENCOUNTER — Emergency Department (HOSPITAL_COMMUNITY)
Admission: EM | Admit: 2017-09-02 | Discharge: 2017-09-02 | Disposition: A | Discharge status: Home / Self Care | Payer: Medicare Other | Attending: Emergency Medicine | Admitting: Emergency Medicine

## 2017-09-02 ENCOUNTER — Encounter (HOSPITAL_COMMUNITY): Payer: Self-pay | Admitting: Emergency Medicine

## 2017-09-02 ENCOUNTER — Emergency Department (HOSPITAL_COMMUNITY): Payer: Medicare Other

## 2017-09-02 DIAGNOSIS — Z7901 Long term (current) use of anticoagulants: Secondary | ICD-10-CM | POA: Insufficient documentation

## 2017-09-02 DIAGNOSIS — Z79899 Other long term (current) drug therapy: Secondary | ICD-10-CM | POA: Insufficient documentation

## 2017-09-02 DIAGNOSIS — M79671 Pain in right foot: Secondary | ICD-10-CM | POA: Diagnosis not present

## 2017-09-02 DIAGNOSIS — M25561 Pain in right knee: Secondary | ICD-10-CM | POA: Diagnosis not present

## 2017-09-02 NOTE — Discharge Instructions (Addendum)
If you develop headaches, nausea vomiting, blurred vision, neck pain, weakness, or have any concerns then please seek additional medical care and evaluation.  Please make sure that you are keeping your right knee and foot elevated above your heart.  This will help reduce swelling and bleeding.  Because you take blood thinners you are at risk of bleeding into your joints and having more bruising.    Today you refused CAT scans of your head, neck, and additional work-up.  If you experience any worsening symptoms then please seek additional medical care and evaluation.  Please follow-up with your primary care provider.  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 Recommend  follow-up plain film in 3-6 months to ensure stability.

## 2017-09-02 NOTE — ED Notes (Signed)
Patient refused INR

## 2017-09-02 NOTE — ED Provider Notes (Signed)
[facility]-EMERGENCY DEPT Provider Note   CSN: [id number] Arrival date & time: 09/02/17  1554     History   Chief Complaint Chief Complaint  Patient presents with  . Fall  . Toe Pain  . Knee Pain    HPI Kathleen Lin is a 72 y.o. female who presents today for evaluation after a fall.  She reports that she was at her mother's house weeding when she tripped over a stump and fell landing on her right knee.  She reports pain in her right great toe and in her right knee.  She denies striking her head or passing out.  No neck pain.  Patient does take warfarin, reports compliance with her medications.  She denies any nausea, vomiting, chest, abdominal pain.    HPI  Past Medical History:  Diagnosis Date  . Antiphospholipid antibody syndrome (HCC)   . Hypercholesteremia   . Migraines   . Polycythemia   . Stroke [facility]) [id number]    Patient Active Problem List   Diagnosis Date Noted  . Insomnia 11/19/2016  . Osteopenia 01/19/2016  . Renal insufficiency 12/20/2015  . Hyperglycemia 12/20/2015  . Anxiety 05/21/2015  . ADD (attention deficit disorder) 05/21/2015  . Overactive bladder 05/20/2015  . Long term current use of anticoagulant 07/02/2012  . Antiphospholipid syndrome (HCC) 11/07/2011  . Hypercholesterolemia 11/07/2011  . Headache, migraine 11/07/2011  . Polycythemia 11/07/2011  . Depression 07/18/2007  . Cerebral artery occlusion with cerebral infarction (HCC) 07/18/2007  . GERD 07/18/2007  . HEMORRHOIDS, HX OF 07/18/2007    Past Surgical History:  Procedure Laterality Date  . ABDOMINAL HYSTERECTOMY    . NASAL SEPTUM SURGERY       OB History   None      Home Medications    Prior to Admission medications   Medication Sig Start Date End Date Taking? Authorizing Provider  atomoxetine (STRATTERA) 100 MG capsule Take 100 mg by mouth daily.    [provider]  Azelastine-Fluticasone (DYMISTA) 137-50 MCG/ACT SUSP Place into the nose as  needed. 03/20/17   Pincus Sanes, MD  calcium carbonate (CALCIUM 600) 600 MG TABS tablet Take 1 tablet (600 mg total) by mouth daily with breakfast. 05/21/16   Pincus Sanes, MD  Cholecalciferol (VITAMIN D3) 2000 units capsule Take 1 capsule (2,000 Units total) by mouth daily. 05/21/16   Pincus Sanes, MD  cycloSPORINE (RESTASIS) 0.05 % ophthalmic emulsion 1 drop 2 (two) times daily.    [provider]  LORazepam (ATIVAN) 0.5 MG tablet Take 0.5 mg by mouth as needed for anxiety.    [provider]  oxybutynin (DITROPAN-XL) 10 MG 24 hr tablet Take 10 mg by mouth daily. 05/07/17   [provider]  Propylene Glycol-Glycerin (SOOTHE) 0.6-0.6 % SOLN Apply 1 drop to eye daily. 05/21/16   Pincus Sanes, MD  RABEprazole (ACIPHEX) 20 MG tablet TAKE 1 TABLET (20 MG TOTAL) BY MOUTH DAILY AS NEEDED. 07/01/17   Pincus Sanes, MD  sertraline (ZOLOFT) 50 MG tablet  12/13/15   [provider]  simvastatin (ZOCOR) 80 MG tablet Take 1 tablet (80 mg total) by mouth daily. 07/23/16   Pincus Sanes, MD  traZODone (DESYREL) 50 MG tablet TAKE 1 TABLET BY MOUTH EVERY DAY AT BEDTIME AS NEEDED 07/15/17   Pincus Sanes, MD  triamcinolone ointment (KENALOG) 0.1 % Apply twice daily 10/25/16   [provider]  warfarin (COUMADIN) 5 MG tablet Take 5 mg by mouth  as directed. INR should be 2.5-3.5    [provider]    Family History Family History  Problem Relation Age of Onset  . Hypertension Mother   . Dementia Mother   . Hearing loss Mother   . Heart disease Father   . Hyperlipidemia Father   . Miscarriages / Stillbirths Maternal Grandfather   . Non-Hodgkin's lymphoma Brother     Social History Social History   Tobacco Use  . Smoking status: Never Smoker  . Smokeless tobacco: Never Used  Substance Use Topics  . Alcohol use: Yes  . Drug use: No     Allergies   Patient has no known allergies.   Review of Systems Review of Systems  Constitutional:  Negative for chills and fever.  Cardiovascular: Negative for chest pain.  Gastrointestinal: Negative for abdominal distention, nausea and vomiting.  Musculoskeletal: Negative for back pain, neck pain and neck stiffness.       Pain in right knee and right great toe.  Skin: Negative for color change and wound.  Neurological: Negative for dizziness, seizures, weakness and headaches.     Physical Exam Updated Vital Signs BP (!) 159/96 (BP Location: Left Arm)   Pulse 91   Temp 97.7 F (36.5 C) (Oral)   Resp 19   Ht  (1.676 m)   Wt 84.4 kg (186 lb)   SpO2 100%   BMI 30.02 kg/m   Physical Exam  Constitutional: She is oriented to person, place, and time. She appears well-developed and well-nourished.  HENT:  Head: Normocephalic and atraumatic.  Cardiovascular: Intact distal pulses.  2+ DP/PT pulses bilaterally.  Musculoskeletal:  There is slight swelling over the right great toe.  Right knee is grossly stable to anterior/posterior drawer test, valgus/varus stress.  Right great toe is without obvious deformities.  Neurological: She is alert and oriented to person, place, and time.  Sensation intact to bilateral lower extremities.  5+ strength through ankle dorsiflexion and plantarflexion bilaterally.  Skin: She is not diaphoretic.  No obvious wounds over right lower extremity.  No ecchymosis to bilateral lower extremities.  Nursing note and vitals reviewed.    ED Treatments / Results  Labs (all labs ordered are listed, but only abnormal results are displayed) Labs Reviewed  PROTIME-INR    EKG None  Radiology Dg Knee Complete 4 Views Right  Result Date: 09/02/2017 CLINICAL DATA:  Right knee pain after tripping over a stump and falling. Initial encounter. EXAM: RIGHT KNEE - COMPLETE 4+ VIEW COMPARISON:  None. FINDINGS: No acute fracture, dislocation, or knee joint effusion is identified. Mild patellar osteophyte formation is noted. Femorotibial joint space widths are  preserved. Soft tissues are unremarkable. IMPRESSION: No acute osseous abnormality. Electronically Signed   By: Sebastian Ache M.D.   On: 09/02/2017 16:45   Dg Foot Complete Right  Result Date: 09/02/2017 CLINICAL DATA:  Fall, pain to RIGHT big toe EXAM: RIGHT FOOT COMPLETE - 3+ VIEW COMPARISON:  None. FINDINGS: Osseous alignment is normal. No fracture line or displaced fracture fragment seen. 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 No acute or suspicious osseous lesion. Soft tissues about the RIGHT foot are unremarkable. IMPRESSION: 1. No acute findings.  No osseous fracture or dislocation. 2. 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 Recommend follow-up plain film in 3-6 months to ensure stability. Electronically Signed   By: Weyman Croon  Linde Gillis M.D.   On: 09/02/2017 16:47    Procedures Procedures (including critical care time)  Medications Ordered in ED Medications - No data to display   Initial Impression / Assessment and Plan / ED Course  I have reviewed the triage vital signs and the nursing notes.  Pertinent labs & imaging results that were available during my care of the patient were reviewed by me and considered in my medical decision making (see chart for details).    Patient presents today for evaluation after a fall at ground level.  She landed on her right knee and has pain in her right knee and right great toe.  X-rays were obtained without acute abnormalities.  Patient is anticoagulated on warfarin, we discussed the risks and benefits of CAT scan.  She states she did not strike her head and she is positive she is not having any bleeding into her brain.  She refused CAT scan of her head after being informed of the risks of making this decision.  She was instructed on conservative care for her lower extremity including elevation,  ice, compression, and Tylenol for her pain.  She declined crutches.  Patient discharged home.  She was informed of incidental finding and need for repeat imaging in 3 to 6 weeks.  The summary was given to her verbally and in writing on her discharge papers.    Final Clinical Impressions(s) / ED Diagnoses   Final diagnoses:  Acute pain of right knee  Right foot pain    ED Discharge Orders    None       Norman Clay 09/02/17 2009    Loren Racer, MD 09/05/17 [phone]

## 2017-09-02 NOTE — ED Triage Notes (Signed)
Pt reports she was at her mother's house weeding when she tripped over a stump and fell. Pain in right knee and right big toe. Pt is ambulatory to triage. Pt is on blood thinners but no LOC when fell.

## 2017-09-02 NOTE — ED Notes (Signed)
Patient declined blood draw @ this time. Stated INR was "perfect" on Thursday of last week.

## 2017-09-03 ENCOUNTER — Other Ambulatory Visit: Payer: Self-pay | Admitting: Internal Medicine

## 2017-10-24 ENCOUNTER — Other Ambulatory Visit: Payer: Self-pay | Admitting: Internal Medicine

## 2017-11-27 ENCOUNTER — Encounter: Payer: Medicare Other | Admitting: Internal Medicine

## 2017-12-18 ENCOUNTER — Encounter: Payer: Medicare Other | Admitting: Internal Medicine

## 2017-12-30 NOTE — Progress Notes (Deleted)
Subjective:    Patient ID: Kathleen Lin, female    DOB: June 15, 1945, 72 y.o.   MRN: [id number]  HPI She is here for a physical exam.     Medications and allergies reviewed with patient and updated if appropriate.  Patient Active Problem List   Diagnosis Date Noted  . Insomnia 11/19/2016  . Osteopenia 01/19/2016  . Renal insufficiency 12/20/2015  . Hyperglycemia 12/20/2015  . Anxiety 05/21/2015  . ADD (attention deficit disorder) 05/21/2015  . Overactive bladder 05/20/2015  . Long term current use of anticoagulant 07/02/2012  . Antiphospholipid syndrome (HCC) 11/07/2011  . Hypercholesterolemia 11/07/2011  . Headache, migraine 11/07/2011  . Polycythemia 11/07/2011  . Depression 07/18/2007  . Cerebral artery occlusion with cerebral infarction (HCC) 07/18/2007  . GERD 07/18/2007  . HEMORRHOIDS, HX OF 07/18/2007    Current Outpatient Medications on File Prior to Visit  Medication Sig Dispense Refill  . atomoxetine (STRATTERA) 100 MG capsule Take 100 mg by mouth daily.    . Azelastine-Fluticasone (DYMISTA) 137-50 MCG/ACT SUSP Place into the nose as needed. 23 g 8  . calcium carbonate (CALCIUM 600) 600 MG TABS tablet Take 1 tablet (600 mg total) by mouth daily with breakfast. 60 tablet   . Cholecalciferol (VITAMIN D3) 2000 units capsule Take 1 capsule (2,000 Units total) by mouth daily.    . cycloSPORINE (RESTASIS) 0.05 % ophthalmic emulsion 1 drop 2 (two) times daily.    Marland Kitchen LORazepam (ATIVAN) 0.5 MG tablet Take 0.5 mg by mouth as needed for anxiety.    Marland Kitchen oxybutynin (DITROPAN-XL) 10 MG 24 hr tablet Take 10 mg by mouth daily.  11  . Propylene Glycol-Glycerin (SOOTHE) 0.6-0.6 % SOLN Apply 1 drop to eye daily.    . RABEprazole (ACIPHEX) 20 MG tablet TAKE 1 TABLET (20 MG TOTAL) BY MOUTH DAILY AS NEEDED. 30 tablet 5  . sertraline (ZOLOFT) 50 MG tablet     . simvastatin (ZOCOR) 80 MG tablet TAKE 1 TABLET (80 MG TOTAL) BY MOUTH DAILY. 90 tablet 1  . traZODone (DESYREL) 50 MG tablet  TAKE 1 TABLET BY MOUTH EVERY DAY AT BEDTIME AS NEEDED 90 tablet 1  . triamcinolone ointment (KENALOG) 0.1 % Apply twice daily    . warfarin (COUMADIN) 5 MG tablet Take 5 mg by mouth as directed. INR should be 2.5-3.5     No current facility-administered medications on file prior to visit.     Past Medical History:  Diagnosis Date  . Antiphospholipid antibody syndrome (HCC)   . Hypercholesteremia   . Migraines   . Polycythemia   . Stroke [facility]) [id number]    Past Surgical History:  Procedure Laterality Date  . ABDOMINAL HYSTERECTOMY    . NASAL SEPTUM SURGERY      Social History   Socioeconomic History  . Marital status: Widowed    Spouse name: Not on file  . Number of children: Not on file  . Years of education: Not on file  . Highest education level: Not on file  Occupational History  . Not on file  Social Needs  . Financial resource strain: Not on file  . Food insecurity:    Worry: Not on file    Inability: Not on file  . Transportation needs:    Medical: Not on file    Non-medical: Not on file  Tobacco Use  . Smoking status: Never Smoker  . Smokeless tobacco: Never Used  Substance and Sexual Activity  . Alcohol use: Yes  . Drug use:  No  . Sexual activity: Not on file  Lifestyle  . Physical activity:    Days per week: Not on file    Minutes per session: Not on file  . Stress: Not on file  Relationships  . Social connections:    Talks on phone: Not on file    Gets together: Not on file    Attends religious service: Not on file    Active member of club or organization: Not on file    Attends meetings of clubs or organizations: Not on file    Relationship status: Not on file  Other Topics Concern  . Not on file  Social History Narrative   Not exercising regularly    Family History  Problem Relation Age of Onset  . Hypertension Mother   . Dementia Mother   . Hearing loss Mother   . Heart disease Father   . Hyperlipidemia Father   . Miscarriages /  Stillbirths Maternal Grandfather   . Non-Hodgkin's lymphoma Brother     Review of Systems     Objective:  There were no vitals filed for this visit. There were no vitals filed for this visit. There is no height or weight on file to calculate BMI.  Wt Readings from Last 3 Encounters:  09/02/17 186 lb (84.4 kg)  05/28/17 190 lb (86.2 kg)  11/19/16 194 lb (88 kg)     Physical Exam Constitutional: She appears well-developed and well-nourished. No distress.  HENT:  Head: Normocephalic and atraumatic.  Right Ear: External ear normal. Normal ear canal and TM Left Ear: External ear normal.  Normal ear canal and TM Mouth/Throat: Oropharynx is clear and moist.  Eyes: Conjunctivae and EOM are normal.  Neck: Neck supple. No tracheal deviation present. No thyromegaly present.  No carotid bruit  Cardiovascular: Normal rate, regular rhythm and normal heart sounds.   No murmur heard.  No edema. Pulmonary/Chest: Effort normal and breath sounds normal. No respiratory distress. She has no wheezes. She has no rales.  Breast: deferred to Gyn Abdominal: Soft. She exhibits no distension. There is no tenderness.  Lymphadenopathy: She has no cervical adenopathy.  Skin: Skin is warm and dry. She is not diaphoretic.  Psychiatric: She has a normal mood and affect. Her behavior is normal.        Assessment & Plan:   Physical exam: Screening blood work   ordered Immunizations   discussed Shingrix, flu vaccine due Colonoscopy   up-to-date-due 2020 Mammogram Gyn Dexa   due later this month Eye exams EKG Exercise Weight Skin  Substance abuse  See Problem List for Assessment and Plan of chronic medical problems.

## 2017-12-31 ENCOUNTER — Encounter: Payer: Medicare Other | Admitting: Internal Medicine

## 2017-12-31 DIAGNOSIS — Z0289 Encounter for other administrative examinations: Secondary | ICD-10-CM

## 2018-01-08 ENCOUNTER — Telehealth: Payer: Self-pay

## 2018-01-08 NOTE — Telephone Encounter (Signed)
Copied from CRM [id number]. Topic: Inquiry >> Jan 08, 2018  8:56 AM Crist InfanteHarrald, Kathy J wrote: Reason for CRM:  pt would like to know how many strains the flu shot has in it this year? Pt would like you to leave a message with this info on her cell phone  Left message advising patient that high dose is 4x stronger than regular dose, containing 2 A strains, and 1 B strain in regular dose, each strain has 15 mg per , call back if any further questions

## 2018-01-14 ENCOUNTER — Other Ambulatory Visit: Payer: Self-pay | Admitting: Internal Medicine

## 2018-01-28 ENCOUNTER — Encounter: Payer: Medicare Other | Admitting: Internal Medicine

## 2018-02-03 ENCOUNTER — Other Ambulatory Visit: Payer: Self-pay | Admitting: Psychiatry

## 2018-02-09 NOTE — Progress Notes (Signed)
Subjective:    Patient ID: Kathleen Lin, female    DOB: Oct 10, 1945, 72 y.o.   MRN: [id number]  HPI      Medications and allergies reviewed with patient and updated if appropriate.  Patient Active Problem List   Diagnosis Date Noted  . Insomnia 11/19/2016  . Osteopenia 01/19/2016  . Renal insufficiency 12/20/2015  . Hyperglycemia 12/20/2015  . Anxiety 05/21/2015  . ADD (attention deficit disorder) 05/21/2015  . Overactive bladder 05/20/2015  . Long term current use of anticoagulant 07/02/2012  . Antiphospholipid syndrome (HCC) 11/07/2011  . Hypercholesterolemia 11/07/2011  . Headache, migraine 11/07/2011  . Polycythemia 11/07/2011  . Depression 07/18/2007  . Cerebral artery occlusion with cerebral infarction (HCC) 07/18/2007  . GERD 07/18/2007  . HEMORRHOIDS, HX OF 07/18/2007    Current Outpatient Medications on File Prior to Visit  Medication Sig Dispense Refill  . atomoxetine (STRATTERA) 100 MG capsule TAKE 1 CAPSULE BY MOUTH EVERY DAY 6 capsule 0  . Azelastine-Fluticasone (DYMISTA) 137-50 MCG/ACT SUSP Place into the nose as needed. 23 g 8  . calcium carbonate (CALCIUM 600) 600 MG TABS tablet Take 1 tablet (600 mg total) by mouth daily with breakfast. 60 tablet   . Cholecalciferol (VITAMIN D3) 2000 units capsule Take 1 capsule (2,000 Units total) by mouth daily.    . cycloSPORINE (RESTASIS) 0.05 % ophthalmic emulsion 1 drop 2 (two) times daily.    Marland Kitchen LORazepam (ATIVAN) 0.5 MG tablet Take 0.5 mg by mouth as needed for anxiety.    Marland Kitchen oxybutynin (DITROPAN-XL) 10 MG 24 hr tablet Take 10 mg by mouth daily.  11  . Propylene Glycol-Glycerin (SOOTHE) 0.6-0.6 % SOLN Apply 1 drop to eye daily.    . RABEprazole (ACIPHEX) 20 MG tablet TAKE 1 TABLET (20 MG TOTAL) BY MOUTH DAILY AS NEEDED. 30 tablet 5  . sertraline (ZOLOFT) 50 MG tablet     . simvastatin (ZOCOR) 80 MG tablet TAKE 1 TABLET (80 MG TOTAL) BY MOUTH DAILY. 90 tablet 1  . traZODone (DESYREL) 50 MG tablet TAKE 1 TABLET BY  MOUTH EVERY DAY AT BEDTIME AS NEEDED 90 tablet 1  . triamcinolone ointment (KENALOG) 0.1 % Apply twice daily    . warfarin (COUMADIN) 5 MG tablet Take 5 mg by mouth as directed. INR should be 2.5-3.5     No current facility-administered medications on file prior to visit.     Past Medical History:  Diagnosis Date  . Antiphospholipid antibody syndrome (HCC)   . Hypercholesteremia   . Migraines   . Polycythemia   . Stroke [facility]) [id number]    Past Surgical History:  Procedure Laterality Date  . ABDOMINAL HYSTERECTOMY    . NASAL SEPTUM SURGERY      Social History   Socioeconomic History  . Marital status: Widowed    Spouse name: Not on file  . Number of children: Not on file  . Years of education: Not on file  . Highest education level: Not on file  Occupational History  . Not on file  Social Needs  . Financial resource strain: Not on file  . Food insecurity:    Worry: Not on file    Inability: Not on file  . Transportation needs:    Medical: Not on file    Non-medical: Not on file  Tobacco Use  . Smoking status: Never Smoker  . Smokeless tobacco: Never Used  Substance and Sexual Activity  . Alcohol use: Yes  . Drug use: No  . Sexual  activity: Not on file  Lifestyle  . Physical activity:    Days per week: Not on file    Minutes per session: Not on file  . Stress: Not on file  Relationships  . Social connections:    Talks on phone: Not on file    Gets together: Not on file    Attends religious service: Not on file    Active member of club or organization: Not on file    Attends meetings of clubs or organizations: Not on file    Relationship status: Not on file  Other Topics Concern  . Not on file  Social History Narrative   Not exercising regularly    Family History  Problem Relation Age of Onset  . Hypertension Mother   . Dementia Mother   . Hearing loss Mother   . Heart disease Father   . Hyperlipidemia Father   . Miscarriages / Stillbirths Maternal  Grandfather   . Non-Hodgkin's lymphoma Brother     Review of Systems     Objective:  There were no vitals filed for this visit. There were no vitals filed for this visit. There is no height or weight on file to calculate BMI.  Wt Readings from Last 3 Encounters:  09/02/17 186 lb (84.4 kg)  05/28/17 190 lb (86.2 kg)  11/19/16 194 lb (88 kg)     Physical Exam         Assessment & Plan:          This encounter was created in error - please disregard.

## 2018-02-10 ENCOUNTER — Encounter: Payer: Medicare Other | Admitting: Internal Medicine

## 2018-02-12 ENCOUNTER — Ambulatory Visit (INDEPENDENT_AMBULATORY_CARE_PROVIDER_SITE_OTHER): Payer: Medicare Other | Admitting: Internal Medicine

## 2018-02-12 ENCOUNTER — Other Ambulatory Visit (INDEPENDENT_AMBULATORY_CARE_PROVIDER_SITE_OTHER): Payer: Medicare Other

## 2018-02-12 ENCOUNTER — Encounter: Payer: Self-pay | Admitting: Internal Medicine

## 2018-02-12 VITALS — BP 122/80 | HR 82 | Temp 97.9°F | Resp 16 | Ht 66.0 in | Wt 178.0 lb

## 2018-02-12 DIAGNOSIS — F419 Anxiety disorder, unspecified: Secondary | ICD-10-CM

## 2018-02-12 DIAGNOSIS — H9190 Unspecified hearing loss, unspecified ear: Secondary | ICD-10-CM | POA: Diagnosis not present

## 2018-02-12 DIAGNOSIS — K219 Gastro-esophageal reflux disease without esophagitis: Secondary | ICD-10-CM

## 2018-02-12 DIAGNOSIS — M85861 Other specified disorders of bone density and structure, right lower leg: Secondary | ICD-10-CM | POA: Diagnosis not present

## 2018-02-12 DIAGNOSIS — E7800 Pure hypercholesterolemia, unspecified: Secondary | ICD-10-CM

## 2018-02-12 DIAGNOSIS — R739 Hyperglycemia, unspecified: Secondary | ICD-10-CM | POA: Diagnosis not present

## 2018-02-12 DIAGNOSIS — F32A Depression, unspecified: Secondary | ICD-10-CM

## 2018-02-12 DIAGNOSIS — Z0000 Encounter for general adult medical examination without abnormal findings: Secondary | ICD-10-CM

## 2018-02-12 DIAGNOSIS — N3281 Overactive bladder: Secondary | ICD-10-CM

## 2018-02-12 DIAGNOSIS — G4709 Other insomnia: Secondary | ICD-10-CM

## 2018-02-12 DIAGNOSIS — F988 Other specified behavioral and emotional disorders with onset usually occurring in childhood and adolescence: Secondary | ICD-10-CM

## 2018-02-12 DIAGNOSIS — F329 Major depressive disorder, single episode, unspecified: Secondary | ICD-10-CM

## 2018-02-12 DIAGNOSIS — M85862 Other specified disorders of bone density and structure, left lower leg: Secondary | ICD-10-CM

## 2018-02-12 LAB — COMPREHENSIVE METABOLIC PANEL
ALT: 8 U/L (ref 0–35)
AST: 13 U/L (ref 0–37)
Albumin: 4.1 g/dL (ref 3.5–5.2)
Alkaline Phosphatase: 71 U/L (ref 39–117)
BILIRUBIN TOTAL: 1 mg/dL (ref 0.2–1.2)
BUN: 18 mg/dL (ref 6–23)
CO2: 27 meq/L (ref 19–32)
CREATININE: 1.12 mg/dL (ref 0.40–1.20)
Calcium: 9.2 mg/dL (ref 8.4–10.5)
Chloride: 104 mEq/L (ref 96–112)
GFR: 51.11 mL/min — ABNORMAL LOW (ref >60.00)
GLUCOSE: 103 mg/dL — AB (ref 70–99)
Potassium: 4.4 mEq/L (ref 3.5–5.1)
Sodium: 138 mEq/L (ref 135–145)
Total Protein: 7.4 g/dL (ref 6.0–8.3)

## 2018-02-12 LAB — TSH: TSH: 2.41 u[IU]/mL (ref 0.35–4.50)

## 2018-02-12 LAB — LIPID PANEL
CHOL/HDL RATIO: 4
Cholesterol: 123 mg/dL (ref 0–200)
HDL: 28.3 mg/dL — ABNORMAL LOW (ref >39.00)
LDL CALC: 77 mg/dL (ref 0–99)
NONHDL: 95.14
Triglycerides: 89 mg/dL (ref 0.0–149.0)
VLDL: 17.8 mg/dL (ref 0.0–40.0)

## 2018-02-12 LAB — CBC WITH DIFFERENTIAL/PLATELET
BASOS ABS: 0 10*3/uL (ref 0.0–0.1)
BASOS PCT: 0.5 % (ref 0.0–3.0)
EOS ABS: 0 10*3/uL (ref 0.0–0.7)
Eosinophils Relative: 0.5 % (ref 0.0–5.0)
HEMATOCRIT: 39.6 % (ref 36.0–46.0)
Hemoglobin: 13.3 g/dL (ref 12.0–15.0)
LYMPHS ABS: 1 10*3/uL (ref 0.7–4.0)
LYMPHS PCT: 17.8 % (ref 12.0–46.0)
MCHC: 33.5 g/dL (ref 30.0–36.0)
MCV: 85.4 fl (ref 78.0–100.0)
MONOS PCT: 12.3 % — AB (ref 3.0–12.0)
Monocytes Absolute: 0.7 10*3/uL (ref 0.1–1.0)
NEUTROS ABS: 3.7 10*3/uL (ref 1.4–7.7)
NEUTROS PCT: 68.9 % (ref 43.0–77.0)
PLATELETS: 101 10*3/uL — AB (ref 150.0–400.0)
RBC: 4.64 Mil/uL (ref 3.87–5.11)
RDW: 15.3 % (ref 11.5–15.5)
WBC: 5.4 10*3/uL (ref 4.0–10.5)

## 2018-02-12 LAB — HEMOGLOBIN A1C: Hgb A1c MFr Bld: 5.3 % (ref 4.6–6.5)

## 2018-02-12 MED ORDER — AZELASTINE-FLUTICASONE 137-50 MCG/ACT NA SUSP: NASAL | 8 refills | Status: DC

## 2018-02-12 NOTE — Assessment & Plan Note (Signed)
Management per psych 

## 2018-02-12 NOTE — Patient Instructions (Addendum)
Tests ordered today. Your results will be released to [facility] (or called to you) after review, usually within 72hours after test completion. If any changes need to be made, you will be notified at that same time.  All other Health Maintenance issues reviewed.   All recommended immunizations and age-appropriate screenings are up-to-date or discussed.  No immunizations administered today.   Medications reviewed and updated.  Changes include :   none  Your prescription(s) have been submitted to your pharmacy. Please take as directed and contact our office if you believe you are having problem(s) with the medication(s).  Please followup in 6 months    Health Maintenance, Female Adopting a healthy lifestyle and getting preventive care can go a long way to promote health and wellness. Talk with your health care provider about what schedule of regular examinations is right for you. This is a good chance for you to check in with your provider about disease prevention and staying healthy. In between checkups, there are plenty of things you can do on your own. Experts have done a lot of research about which lifestyle changes and preventive measures are most likely to keep you healthy. Ask your health care provider for more information. Weight and diet Eat a healthy diet  Be sure to include plenty of vegetables, fruits, low-fat dairy products, and lean protein.  Do not eat a lot of foods high in solid fats, added sugars, or salt.  Get regular exercise. This is one of the most important things you can do for your health. ? Most adults should exercise for at least 150 minutes each week. The exercise should increase your heart rate and make you sweat (moderate-intensity exercise). ? Most adults should also do strengthening exercises at least twice a week. This is in addition to the moderate-intensity exercise.  Maintain a healthy weight  Body mass index (BMI) is a measurement that can be used to  identify possible weight problems. It estimates body fat based on height and weight. Your health care provider can help determine your BMI and help you achieve or maintain a healthy weight.  For females 26 years of age and older: ? A BMI below 18.5 is considered underweight. ? A BMI of 18.5 to 24.9 is normal. ? A BMI of 25 to 29.9 is considered overweight. ? A BMI of 30 and above is considered obese.  Watch levels of cholesterol and blood lipids  You should start having your blood tested for lipids and cholesterol at 70 years of age, then have this test every 5 years.  You may need to have your cholesterol levels checked more often if: ? Your lipid or cholesterol levels are high. ? You are older than 70 years of age. ? You are at high risk for heart disease.  Cancer screening Lung Cancer  Lung cancer screening is recommended for adults 29-35 years old who are at high risk for lung cancer because of a history of smoking.  A yearly low-dose CT scan of the lungs is recommended for people who: ? Currently smoke. ? Have quit within the past 15 years. ? Have at least a 30-pack-year history of smoking. A pack year is smoking an average of one pack of cigarettes a day for 1 year.  Yearly screening should continue until it has been 15 years since you quit.  Yearly screening should stop if you develop a health problem that would prevent you from having lung cancer treatment.  Breast Cancer  Practice breast self-awareness.  This means understanding how your breasts normally appear and feel.  It also means doing regular breast self-exams. Let your health care provider know about any changes, no matter how small.  If you are in your 20s or 30s, you should have a clinical breast exam (CBE) by a health care provider every 1-3 years as part of a regular health exam.  If you are 35 or older, have a CBE every year. Also consider having a breast X-ray (mammogram) every year.  If you have a  family history of breast cancer, talk to your health care provider about genetic screening.  If you are at high risk for breast cancer, talk to your health care provider about having an MRI and a mammogram every year.  Breast cancer gene (BRCA) assessment is recommended for women who have family members with BRCA-related cancers. BRCA-related cancers include: ? Breast. ? Ovarian. ? Tubal. ? Peritoneal cancers.  Results of the assessment will determine the need for genetic counseling and BRCA1 and BRCA2 testing.  Cervical Cancer Your health care provider may recommend that you be screened regularly for cancer of the pelvic organs (ovaries, uterus, and vagina). This screening involves a pelvic examination, including checking for microscopic changes to the surface of your cervix (Pap test). You may be encouraged to have this screening done every 3 years, beginning at age 4.  For women ages 16-65, health care providers may recommend pelvic exams and Pap testing every 3 years, or they may recommend the Pap and pelvic exam, combined with testing for human papilloma virus (HPV), every 5 years. Some types of HPV increase your risk of cervical cancer. Testing for HPV may also be done on women of any age with unclear Pap test results.  Other health care providers may not recommend any screening for nonpregnant women who are considered low risk for pelvic cancer and who do not have symptoms. Ask your health care provider if a screening pelvic exam is right for you.  If you have had past treatment for cervical cancer or a condition that could lead to cancer, you need Pap tests and screening for cancer for at least 20 years after your treatment. If Pap tests have been discontinued, your risk factors (such as having a new sexual partner) need to be reassessed to determine if screening should resume. Some women have medical problems that increase the chance of getting cervical cancer. In these cases, your  health care provider may recommend more frequent screening and Pap tests.  Colorectal Cancer  This type of cancer can be detected and often prevented.  Routine colorectal cancer screening usually begins at 70 years of age and continues through 70 years of age.  Your health care provider may recommend screening at an earlier age if you have risk factors for colon cancer.  Your health care provider may also recommend using home test kits to check for hidden blood in the stool.  A small camera at the end of a tube can be used to examine your colon directly (sigmoidoscopy or colonoscopy). This is done to check for the earliest forms of colorectal cancer.  Routine screening usually begins at age 68.  Direct examination of the colon should be repeated every 5-10 years through 70 years of age. However, you may need to be screened more often if early forms of precancerous polyps or small growths are found.  Skin Cancer  Check your skin from head to toe regularly.  Tell your health care provider about any  new moles or changes in moles, especially if there is a change in a mole's shape or color.  Also tell your health care provider if you have a mole that is larger than the size of a pencil eraser.  Always use sunscreen. Apply sunscreen liberally and repeatedly throughout the day.  Protect yourself by wearing long sleeves, pants, a wide-brimmed hat, and sunglasses whenever you are outside.  Heart disease, diabetes, and high blood pressure  High blood pressure causes heart disease and increases the risk of stroke. High blood pressure is more likely to develop in: ? People who have blood pressure in the high end of the normal range (130-139/85-89 mm Hg). ? People who are overweight or obese. ? People who are African American.  If you are 52-53 years of age, have your blood pressure checked every 3-5 years. If you are 79 years of age or older, have your blood pressure checked every year. You  should have your blood pressure measured twice-once when you are at a hospital or clinic, and once when you are not at a hospital or clinic. Record the average of the two measurements. To check your blood pressure when you are not at a hospital or clinic, you can use: ? An automated blood pressure machine at a pharmacy. ? A home blood pressure monitor.  If you are between 14 years and 72 years old, ask your health care provider if you should take aspirin to prevent strokes.  Have regular diabetes screenings. This involves taking a blood sample to check your fasting blood sugar level. ? If you are at a normal weight and have a low risk for diabetes, have this test once every three years after 70 years of age. ? If you are overweight and have a high risk for diabetes, consider being tested at a younger age or more often. Preventing infection Hepatitis B  If you have a higher risk for hepatitis B, you should be screened for this virus. You are considered at high risk for hepatitis B if: ? You were born in a country where hepatitis B is common. Ask your health care provider which countries are considered high risk. ? Your parents were born in a high-risk country, and you have not been immunized against hepatitis B (hepatitis B vaccine). ? You have HIV or AIDS. ? You use needles to inject street drugs. ? You live with someone who has hepatitis B. ? You have had sex with someone who has hepatitis B. ? You get hemodialysis treatment. ? You take certain medicines for conditions, including cancer, organ transplantation, and autoimmune conditions.  Hepatitis C  Blood testing is recommended for: ? Everyone born from [birth year] through 1963. ? Anyone with known risk factors for hepatitis C.  Sexually transmitted infections (STIs)  You should be screened for sexually transmitted infections (STIs) including gonorrhea and chlamydia if: ? You are sexually active and are younger than 70 years of age. ? You  are older than 70 years of age and your health care provider tells you that you are at risk for this type of infection. ? Your sexual activity has changed since you were last screened and you are at an increased risk for chlamydia or gonorrhea. Ask your health care provider if you are at risk.  If you do not have HIV, but are at risk, it may be recommended that you take a prescription medicine daily to prevent HIV infection. This is called pre-exposure prophylaxis (PrEP). You are considered at  risk if: ? You are sexually active and do not regularly use condoms or know the HIV status of your partner(s). ? You take drugs by injection. ? You are sexually active with a partner who has HIV.  Talk with your health care provider about whether you are at high risk of being infected with HIV. If you choose to begin PrEP, you should first be tested for HIV. You should then be tested every 3 months for as long as you are taking PrEP. Pregnancy  If you are premenopausal and you may become pregnant, ask your health care provider about preconception counseling.  If you may become pregnant, take 400 to 800 micrograms (mcg) of folic acid every day.  If you want to prevent pregnancy, talk to your health care provider about birth control (contraception). Osteoporosis and menopause  Osteoporosis is a disease in which the bones lose minerals and strength with aging. This can result in serious bone fractures. Your risk for osteoporosis can be identified using a bone density scan.  If you are 27 years of age or older, or if you are at risk for osteoporosis and fractures, ask your health care provider if you should be screened.  Ask your health care provider whether you should take a calcium or vitamin D supplement to lower your risk for osteoporosis.  Menopause may have certain physical symptoms and risks.  Hormone replacement therapy may reduce some of these symptoms and risks. Talk to your health care  provider about whether hormone replacement therapy is right for you. Follow these instructions at home:  Schedule regular health, dental, and eye exams.  Stay current with your immunizations.  Do not use any tobacco products including cigarettes, chewing tobacco, or electronic cigarettes.  If you are pregnant, do not drink alcohol.  If you are breastfeeding, limit how much and how often you drink alcohol.  Limit alcohol intake to no more than 1 drink per day for nonpregnant women. One drink equals 12 ounces of beer, 5 ounces of wine, or 1 ounces of hard liquor.  Do not use street drugs.  Do not share needles.  Ask your health care provider for help if you need support or information about quitting drugs.  Tell your health care provider if you often feel depressed.  Tell your health care provider if you have ever been abused or do not feel safe at home. This information is not intended to replace advice given to you by your health care provider. Make sure you discuss any questions you have with your health care provider. Document Released: 10/23/2010 Document Revised: 09/15/2015 Document Reviewed: 01/11/2015 Elsevier Interactive Patient Education  [employer].

## 2018-02-12 NOTE — Assessment & Plan Note (Signed)
dexa due - ordered Stressed regular exercise Taking calcium and vitamin D

## 2018-02-12 NOTE — Assessment & Plan Note (Signed)
Check lipid panel  Continue daily statin Regular exercise and healthy diet encouraged  

## 2018-02-12 NOTE — Assessment & Plan Note (Signed)
Takes medication only as needed - 2-3 time a month Continue medication only as needed

## 2018-02-12 NOTE — Assessment & Plan Note (Signed)
Check a1c Low sugar / carb diet Stressed regular exercise, weight loss  

## 2018-02-12 NOTE — Assessment & Plan Note (Signed)
Controlled, stable Continue current dose of medication -  trazodone  

## 2018-02-12 NOTE — Progress Notes (Signed)
Subjective:    Patient ID: Kathleen Lin, female    DOB: July 04, 1945, 72 y.o.   MRN: [id number]  HPI She is here for a physical exam.   She denies changes in her health.  She has had some increased stress recently - she follows with psychiatry and thinks her medication is working well.    Medications and allergies reviewed with patient and updated if appropriate.  Patient Active Problem List   Diagnosis Date Noted  . Insomnia 11/19/2016  . Osteopenia 01/19/2016  . Renal insufficiency 12/20/2015  . Hyperglycemia 12/20/2015  . Anxiety 05/21/2015  . ADD (attention deficit disorder) 05/21/2015  . Overactive bladder 05/20/2015  . Long term current use of anticoagulant 07/02/2012  . Antiphospholipid syndrome (HCC) 11/07/2011  . Hypercholesterolemia 11/07/2011  . Headache, migraine 11/07/2011  . Polycythemia 11/07/2011  . Depression 07/18/2007  . Cerebral artery occlusion with cerebral infarction (HCC) 07/18/2007  . GERD 07/18/2007  . HEMORRHOIDS, HX OF 07/18/2007    Current Outpatient Medications on File Prior to Visit  Medication Sig Dispense Refill  . atomoxetine (STRATTERA) 100 MG capsule TAKE 1 CAPSULE BY MOUTH EVERY DAY 6 capsule 0  . Azelastine-Fluticasone (DYMISTA) 137-50 MCG/ACT SUSP Place into the nose as needed. 23 g 8  . calcium carbonate (CALCIUM 600) 600 MG TABS tablet Take 1 tablet (600 mg total) by mouth daily with breakfast. 60 tablet   . Cholecalciferol (VITAMIN D3) 2000 units capsule Take 1 capsule (2,000 Units total) by mouth daily.    . cycloSPORINE (RESTASIS) 0.05 % ophthalmic emulsion 1 drop 2 (two) times daily.    Marland Kitchen LORazepam (ATIVAN) 0.5 MG tablet Take 0.5 mg by mouth as needed for anxiety.    Marland Kitchen oxybutynin (DITROPAN-XL) 10 MG 24 hr tablet Take 10 mg by mouth daily.  11  . Propylene Glycol-Glycerin (SOOTHE) 0.6-0.6 % SOLN Apply 1 drop to eye daily.    . RABEprazole (ACIPHEX) 20 MG tablet TAKE 1 TABLET (20 MG TOTAL) BY MOUTH DAILY AS NEEDED. 30 tablet 5  .  sertraline (ZOLOFT) 50 MG tablet     . simvastatin (ZOCOR) 80 MG tablet TAKE 1 TABLET (80 MG TOTAL) BY MOUTH DAILY. 90 tablet 1  . traZODone (DESYREL) 50 MG tablet TAKE 1 TABLET BY MOUTH EVERY DAY AT BEDTIME AS NEEDED 90 tablet 1  . triamcinolone ointment (KENALOG) 0.1 % Apply twice daily    . warfarin (COUMADIN) 5 MG tablet Take 5 mg by mouth as directed. INR should be 2.5-3.5     No current facility-administered medications on file prior to visit.     Past Medical History:  Diagnosis Date  . Antiphospholipid antibody syndrome (HCC)   . Hypercholesteremia   . Migraines   . Polycythemia   . Stroke [facility]) [id number]    Past Surgical History:  Procedure Laterality Date  . ABDOMINAL HYSTERECTOMY    . NASAL SEPTUM SURGERY      Social History   Socioeconomic History  . Marital status: Widowed    Spouse name: Not on file  . Number of children: Not on file  . Years of education: Not on file  . Highest education level: Not on file  Occupational History  . Not on file  Social Needs  . Financial resource strain: Not on file  . Food insecurity:    Worry: Not on file    Inability: Not on file  . Transportation needs:    Medical: Not on file    Non-medical: Not on file  Tobacco Use  . Smoking status: Never Smoker  . Smokeless tobacco: Never Used  Substance and Sexual Activity  . Alcohol use: Yes  . Drug use: No  . Sexual activity: Not on file  Lifestyle  . Physical activity:    Days per week: Not on file    Minutes per session: Not on file  . Stress: Not on file  Relationships  . Social connections:    Talks on phone: Not on file    Gets together: Not on file    Attends religious service: Not on file    Active member of club or organization: Not on file    Attends meetings of clubs or organizations: Not on file    Relationship status: Not on file  Other Topics Concern  . Not on file  Social History Narrative   Not exercising regularly    Family History  Problem  Relation Age of Onset  . Hypertension Mother   . Dementia Mother   . Hearing loss Mother   . Heart disease Father   . Hyperlipidemia Father   . Miscarriages / Stillbirths Maternal Grandfather   . Non-Hodgkin's lymphoma Brother     Review of Systems  Constitutional: Negative for chills, fatigue and fever.  Eyes: Negative for visual disturbance.  Respiratory: Negative for cough, shortness of breath and wheezing.   Cardiovascular: Negative for chest pain, palpitations and leg swelling.  Gastrointestinal: Negative for abdominal pain, blood in stool, constipation, diarrhea and nausea.       Occ gerd  Genitourinary: Negative for dysuria and hematuria.  Musculoskeletal: Negative for arthralgias and back pain.  Skin: Negative for color change and rash.       Skin tag  Neurological: Negative for light-headedness and headaches.  Psychiatric/Behavioral: Positive for dysphoric mood. The patient is nervous/anxious.        Objective:   Vitals:   02/12/18 0801  BP: 122/80  Pulse: 82  Resp: 16  Temp: 97.9 F (36.6 C)  SpO2: 98%   Filed Weights   02/12/18 0801  Weight: 178 lb (80.7 kg)   Body mass index is 28.73 kg/m.  Wt Readings from Last 3 Encounters:  02/12/18 178 lb (80.7 kg)  09/02/17 186 lb (84.4 kg)  05/28/17 190 lb (86.2 kg)     Physical Exam Constitutional: She appears well-developed and well-nourished. No distress.  HENT:  Head: Normocephalic and atraumatic.  Right Ear: External ear normal. Normal ear canal and TM Left Ear: External ear normal.  Normal ear canal and TM Mouth/Throat: Oropharynx is clear and moist.  Eyes: Conjunctivae and EOM are normal.  Neck: Neck supple. No tracheal deviation present. No thyromegaly present.  No carotid bruit  Cardiovascular: Normal rate, regular rhythm and normal heart sounds.   No murmur heard.  No edema. Pulmonary/Chest: Effort normal and breath sounds normal. No respiratory distress. She has no wheezes. She has no rales.    Breast: deferred to Gyn Abdominal: Soft. She exhibits no distension. There is no tenderness.  Lymphadenopathy: She has no cervical adenopathy.  Skin: Skin is warm and dry. She is not diaphoretic.  Psychiatric: She has a normal mood and affect. Her behavior is normal.        Assessment & Plan:   Physical exam: Screening blood work  ordered Immunizations   Discussed shingrix, others up to date Colonoscopy    Up to date  Mammogram   In two weeks with Dr Huntley Dec [facility]yn   Up to date  Dexa  Due - last was 2017 - osteopenia -- ordered Eye exams   Up to date  EKG  None on file Exercise    yard work Weight  encouraged weight loss Skin    skin tag under arm Substance abuse  none  See Problem List for Assessment and Plan of chronic medical problems.    FU in 6 months

## 2018-02-12 NOTE — Assessment & Plan Note (Signed)
Taking myrbetriq - helps but she still has incontinence Has follow up with urology

## 2018-02-13 ENCOUNTER — Encounter: Payer: Self-pay | Admitting: Internal Medicine

## 2018-03-03 LAB — PROTIME-INR

## 2018-03-08 ENCOUNTER — Other Ambulatory Visit: Payer: Self-pay | Admitting: Internal Medicine

## 2018-03-14 ENCOUNTER — Other Ambulatory Visit: Payer: Self-pay

## 2018-03-14 MED ORDER — SERTRALINE HCL 50 MG PO TABS: 50.0000 mg | ORAL_TABLET | Freq: Every day | ORAL | 1 refills | Status: DC

## 2018-03-31 ENCOUNTER — Telehealth: Payer: Self-pay | Admitting: Internal Medicine

## 2018-03-31 NOTE — Telephone Encounter (Signed)
Patient declined AWV. SF °

## 2018-04-07 ENCOUNTER — Encounter: Payer: Self-pay | Admitting: Internal Medicine

## 2018-04-07 ENCOUNTER — Ambulatory Visit (INDEPENDENT_AMBULATORY_CARE_PROVIDER_SITE_OTHER): Payer: Medicare Other | Admitting: Internal Medicine

## 2018-04-07 ENCOUNTER — Other Ambulatory Visit: Payer: Medicare Other

## 2018-04-07 VITALS — BP 102/72 | HR 105 | Temp 98.0°F | Resp 16 | Ht 66.0 in | Wt 178.0 lb

## 2018-04-07 DIAGNOSIS — R350 Frequency of micturition: Secondary | ICD-10-CM

## 2018-04-07 DIAGNOSIS — J0100 Acute maxillary sinusitis, unspecified: Secondary | ICD-10-CM | POA: Diagnosis not present

## 2018-04-07 LAB — POCT URINALYSIS DIPSTICK
Bilirubin, UA: NEGATIVE
Glucose, UA: NEGATIVE
Ketones, UA: NEGATIVE
Nitrite, UA: POSITIVE
PH UA: 5.5 (ref 5.0–8.0)
Protein, UA: POSITIVE — AB
RBC UA: NEGATIVE
Spec Grav, UA: >=1.03 — AB (ref 1.010–1.025)
UROBILINOGEN UA: NEGATIVE U/dL — AB

## 2018-04-07 NOTE — Assessment & Plan Note (Signed)
Likely viral in nature No need for an antibiotic at this time Continue saline nasal spray, dymista and other otc cold meds Rest, fluids Call if no improvement

## 2018-04-07 NOTE — Assessment & Plan Note (Signed)
Urine dip suggestive of possible UTI, but symptoms very mild - just frequency for 4 weeks Will send for culture and hold off on antibiotics unless symptoms worsen

## 2018-04-07 NOTE — Patient Instructions (Signed)
Continue the saline nasal spray and dymista nasal spray as needed.    Continue increased rest and fluids.    Call if no improvement    We will call you in a couple of day with your urine culture results.       Upper Respiratory Infection, Adult Most upper respiratory infections (URIs) are a viral infection of the air passages leading to the lungs. A URI affects the nose, throat, and upper air passages. The most common type of URI is nasopharyngitis and is typically referred to as "the common cold." URIs run their course and usually go away on their own. Most of the time, a URI does not require medical attention, but sometimes a bacterial infection in the upper airways can follow a viral infection. This is called a secondary infection. Sinus and middle ear infections are common types of secondary upper respiratory infections. Bacterial pneumonia can also complicate a URI. A URI can worsen asthma and chronic obstructive pulmonary disease (COPD). Sometimes, these complications can require emergency medical care and may be life threatening. What are the causes? Almost all URIs are caused by viruses. A virus is a type of germ and can spread from one person to another. What increases the risk? You may be at risk for a URI if:  You smoke.  You have chronic heart or lung disease.  You have a weakened defense (immune) system.  You are very young or very old.  You have nasal allergies or asthma.  You work in crowded or poorly ventilated areas.  You work in health care facilities or schools.  What are the signs or symptoms? Symptoms typically develop 2-3 days after you come in contact with a cold virus. Most viral URIs last 7-10 days. However, viral URIs from the influenza virus (flu virus) can last 14-18 days and are typically more severe. Symptoms may include:  Runny or stuffy (congested) nose.  Sneezing.  Cough.  Sore throat.  Headache.  Fatigue.  Fever.  Loss of  appetite.  Pain in your forehead, behind your eyes, and over your cheekbones (sinus pain).  Muscle aches.  How is this diagnosed? Your health care provider may diagnose a URI by:  Physical exam.  Tests to check that your symptoms are not due to another condition such as: ? Strep throat. ? Sinusitis. ? Pneumonia. ? Asthma.  How is this treated? A URI goes away on its own with time. It cannot be cured with medicines, but medicines may be prescribed or recommended to relieve symptoms. Medicines may help:  Reduce your fever.  Reduce your cough.  Relieve nasal congestion.  Follow these instructions at home:  Take medicines only as directed by your health care provider.  Gargle warm saltwater or take cough drops to comfort your throat as directed by your health care provider.  Use a warm mist humidifier or inhale steam from a shower to increase air moisture. This may make it easier to breathe.  Drink enough fluid to keep your urine clear or pale yellow.  Eat soups and other clear broths and maintain good nutrition.  Rest as needed.  Return to work when your temperature has returned to normal or as your health care provider advises. You may need to stay home longer to avoid infecting others. You can also use a face mask and careful hand washing to prevent spread of the virus.  Increase the usage of your inhaler if you have asthma.  Do not use any tobacco products, including  cigarettes, chewing tobacco, or electronic cigarettes. If you need help quitting, ask your health care provider. How is this prevented? The best way to protect yourself from getting a cold is to practice good hygiene.  Avoid oral or hand contact with people with cold symptoms.  Wash your hands often if contact occurs.  There is no clear evidence that vitamin C, vitamin E, echinacea, or exercise reduces the chance of developing a cold. However, it is always recommended to get plenty of rest, exercise,  and practice good nutrition. Contact a health care provider if:  You are getting worse rather than better.  Your symptoms are not controlled by medicine.  You have chills.  You have worsening shortness of breath.  You have brown or red mucus.  You have yellow or brown nasal discharge.  You have pain in your face, especially when you bend forward.  You have a fever.  You have swollen neck glands.  You have pain while swallowing.  You have white areas in the back of your throat. Get help right away if:  You have severe or persistent: ? Headache. ? Ear pain. ? Sinus pain. ? Chest pain.  You have chronic lung disease and any of the following: ? Wheezing. ? Prolonged cough. ? Coughing up blood. ? A change in your usual mucus.  You have a stiff neck.  You have changes in your: ? Vision. ? Hearing. ? Thinking. ? Mood. This information is not intended to replace advice given to you by your health care provider. Make sure you discuss any questions you have with your health care provider. Document Released: 10/03/2000 Document Revised: 12/11/2015 Document Reviewed: 07/15/2013 Elsevier Interactive Patient Education  [employer]2018 [employer]

## 2018-04-07 NOTE — Progress Notes (Signed)
Subjective:    Patient ID: Kathleen Lin, female    DOB: 06-07-1945, 72 y.o.   MRN: [id number]  HPI She is here for an acute visit for cold and urinary symptoms.  Her symptoms started 5 days ago.  She is experiencing nasal congestion with discolored sputum, mild PND and cough.  She denies fever, sinus pain, ST, sob, wheeze and headaches.  She has taken saline nasal spray, dymista.   She also states urinary freuquency for 4 weeks.  She denies dysuria, hematuria, change in urine odor or color and nausea.  She has not had any fevers.    Medications and allergies reviewed with patient and updated if appropriate.  Patient Active Problem List   Diagnosis Date Noted  . Insomnia 11/19/2016  . Osteopenia 01/19/2016  . Renal insufficiency 12/20/2015  . Hyperglycemia 12/20/2015  . Anxiety 05/21/2015  . ADD (attention deficit disorder) 05/21/2015  . Overactive bladder 05/20/2015  . Long term current use of anticoagulant 07/02/2012  . Antiphospholipid syndrome (HCC) 11/07/2011  . Hypercholesterolemia 11/07/2011  . Headache, migraine 11/07/2011  . Polycythemia 11/07/2011  . Depression 07/18/2007  . Cerebral artery occlusion with cerebral infarction (HCC) 07/18/2007  . GERD 07/18/2007  . HEMORRHOIDS, HX OF 07/18/2007    Current Outpatient Medications on File Prior to Visit  Medication Sig Dispense Refill  . atomoxetine (STRATTERA) 100 MG capsule TAKE 1 CAPSULE BY MOUTH EVERY DAY 6 capsule 0  . Azelastine-Fluticasone (DYMISTA) 137-50 MCG/ACT SUSP Place into the nose as needed. 23 g 8  . calcium carbonate (CALCIUM 600) 600 MG TABS tablet Take 1 tablet (600 mg total) by mouth daily with breakfast. 60 tablet   . Cholecalciferol (VITAMIN D3) 2000 units capsule Take 1 capsule (2,000 Units total) by mouth daily.    . cycloSPORINE (RESTASIS) 0.05 % ophthalmic emulsion 1 drop 2 (two) times daily.    Marland Kitchen LORazepam (ATIVAN) 0.5 MG tablet Take 0.5 mg by mouth as needed for anxiety.    Marland Kitchen MYRBETRIQ 50  MG TB24 tablet Take 50 mg by mouth daily.  6  . Propylene Glycol-Glycerin (SOOTHE) 0.6-0.6 % SOLN Apply 1 drop to eye daily.    . RABEprazole (ACIPHEX) 20 MG tablet TAKE 1 TABLET (20 MG TOTAL) BY MOUTH DAILY AS NEEDED. 30 tablet 5  . sertraline (ZOLOFT) 50 MG tablet Take 1 tablet (50 mg total) by mouth daily. 90 tablet 1  . simvastatin (ZOCOR) 80 MG tablet TAKE 1 TABLET BY MOUTH EVERY DAY 90 tablet 1  . traZODone (DESYREL) 50 MG tablet TAKE 1 TABLET BY MOUTH EVERY DAY AT BEDTIME AS NEEDED 90 tablet 1  . triamcinolone ointment (KENALOG) 0.1 % Apply twice daily    . warfarin (COUMADIN) 5 MG tablet Take 5 mg by mouth as directed. INR should be 2.5-3.5     No current facility-administered medications on file prior to visit.     Past Medical History:  Diagnosis Date  . Antiphospholipid antibody syndrome (HCC)   . Hypercholesteremia   . Migraines   . Polycythemia   . Stroke [facility]) [id number]    Past Surgical History:  Procedure Laterality Date  . ABDOMINAL HYSTERECTOMY    . NASAL SEPTUM SURGERY      Social History   Socioeconomic History  . Marital status: Widowed    Spouse name: Not on file  . Number of children: Not on file  . Years of education: Not on file  . Highest education level: Not on file  Occupational History  .  Not on file  Social Needs  . Financial resource strain: Not on file  . Food insecurity:    Worry: Not on file    Inability: Not on file  . Transportation needs:    Medical: Not on file    Non-medical: Not on file  Tobacco Use  . Smoking status: Never Smoker  . Smokeless tobacco: Never Used  Substance and Sexual Activity  . Alcohol use: Yes  . Drug use: No  . Sexual activity: Not on file  Lifestyle  . Physical activity:    Days per week: Not on file    Minutes per session: Not on file  . Stress: Not on file  Relationships  . Social connections:    Talks on phone: Not on file    Gets together: Not on file    Attends religious service: Not on file      Active member of club or organization: Not on file    Attends meetings of clubs or organizations: Not on file    Relationship status: Not on file  Other Topics Concern  . Not on file  Social History Narrative   Not exercising regularly    Family History  Problem Relation Age of Onset  . Hypertension Mother   . Dementia Mother   . Hearing loss Mother   . Heart disease Father   . Hyperlipidemia Father   . Miscarriages / Stillbirths Maternal Grandfather   . Non-Hodgkin's lymphoma Brother     Review of Systems  Constitutional: Negative for chills and fever.  HENT: Positive for congestion (discolored mucus) and postnasal drip (little). Negative for ear pain (ear itching), sinus pressure, sinus pain and sore throat.   Respiratory: Positive for cough (some). Negative for chest tightness, shortness of breath and wheezing.   Gastrointestinal: Negative for nausea.  Genitourinary: Positive for frequency. Negative for dysuria and hematuria.       No change in urine color or odor  Musculoskeletal: Negative for myalgias.  Neurological: Positive for dizziness (little). Negative for light-headedness and headaches.       Objective:   Vitals:   04/07/18 1016  BP: 102/72  Pulse: (!) 105  Resp: 16  Temp: 98 F (36.7 C)  SpO2: 98%   Filed Weights   04/07/18 1016  Weight: 178 lb (80.7 kg)   Body mass index is 28.73 kg/m.  Wt Readings from Last 3 Encounters:  04/07/18 178 lb (80.7 kg)  02/12/18 178 lb (80.7 kg)  09/02/17 186 lb (84.4 kg)     Physical Exam GENERAL APPEARANCE: Appears stated age, well appearing, NAD EYES: conjunctiva clear, no icterus HEENT: bilateral tympanic membranes and ear canals normal, oropharynx with mild erythema, no thyromegaly, trachea midline, no cervical or supraclavicular lymphadenopathy LUNGS: Clear to auscultation without wheeze or crackles, unlabored breathing, good air entry bilaterally CARDIOVASCULAR: Normal S1,S2 without murmurs, no  edema SKIN: warm, dry        Assessment & Plan:   See Problem List for Assessment and Plan of chronic medical problems.

## 2018-04-09 ENCOUNTER — Telehealth: Payer: Self-pay

## 2018-04-09 LAB — URINE CULTURE
MICRO NUMBER: 91502016
SPECIMEN QUALITY: ADEQUATE

## 2018-04-09 MED ORDER — NITROFURANTOIN MONOHYD MACRO 100 MG PO CAPS: 100.0000 mg | ORAL_CAPSULE | Freq: Two times a day (BID) | ORAL | 0 refills | Status: DC

## 2018-04-09 NOTE — Telephone Encounter (Signed)
It just came back today - there is an infection   nitrofurantoin sent to [facility] - this antibiotic is less likely to interact with the warfarin and should treat the infection

## 2018-04-09 NOTE — Telephone Encounter (Signed)
Pt aware of results 

## 2018-04-09 NOTE — Telephone Encounter (Signed)
Copied from CRM [id number]. Topic: General - Other >> Apr 09, 2018 11:32 AM Fanny BienIlderton, Jessica L wrote: Reason for CRM: pt called to check status of urine test. Please advise

## 2018-05-08 ENCOUNTER — Encounter: Payer: Self-pay | Admitting: Emergency Medicine

## 2018-05-09 ENCOUNTER — Other Ambulatory Visit: Payer: Self-pay | Admitting: Psychiatry

## 2018-06-02 LAB — PROTIME-INR: INR: 4 — AB (ref 0.9–1.1)

## 2018-06-26 ENCOUNTER — Ambulatory Visit (INDEPENDENT_AMBULATORY_CARE_PROVIDER_SITE_OTHER): Payer: Medicare Other | Admitting: Psychiatry

## 2018-06-26 ENCOUNTER — Encounter: Payer: Self-pay | Admitting: Psychiatry

## 2018-06-26 DIAGNOSIS — F411 Generalized anxiety disorder: Secondary | ICD-10-CM | POA: Diagnosis not present

## 2018-06-26 DIAGNOSIS — I6930 Unspecified sequelae of cerebral infarction: Secondary | ICD-10-CM

## 2018-06-26 DIAGNOSIS — G3184 Mild cognitive impairment, so stated: Secondary | ICD-10-CM

## 2018-06-26 DIAGNOSIS — F331 Major depressive disorder, recurrent, moderate: Secondary | ICD-10-CM | POA: Diagnosis not present

## 2018-06-26 MED ORDER — TRAZODONE HCL 50 MG PO TABS: ORAL_TABLET | ORAL | 1 refills | Status: DC

## 2018-06-26 MED ORDER — SERTRALINE HCL 100 MG PO TABS: 100.0000 mg | ORAL_TABLET | Freq: Every day | ORAL | 0 refills | Status: DC

## 2018-06-26 MED ORDER — ATOMOXETINE HCL 100 MG PO CAPS: 100.0000 mg | ORAL_CAPSULE | Freq: Every day | ORAL | 1 refills | Status: DC
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
## 2018-06-26 NOTE — Patient Instructions (Signed)
Increase sertraline to 2 of the 50 mg tablets daily or with a new prescription 1 of the 100 mg tablets to help with irritability and tearfulness and overall mood

## 2018-07-26 ENCOUNTER — Other Ambulatory Visit: Payer: Self-pay | Admitting: Internal Medicine

## 2018-08-14 ENCOUNTER — Other Ambulatory Visit: Payer: Self-pay

## 2018-08-14 ENCOUNTER — Ambulatory Visit: Payer: Medicare Other | Admitting: Internal Medicine

## 2018-08-24 ENCOUNTER — Other Ambulatory Visit: Payer: Self-pay | Admitting: Psychiatry

## 2018-08-27 ENCOUNTER — Other Ambulatory Visit: Payer: Self-pay

## 2018-08-27 ENCOUNTER — Ambulatory Visit: Payer: Medicare Other | Admitting: Psychiatry

## 2018-08-28 ENCOUNTER — Telehealth: Payer: Self-pay | Admitting: Internal Medicine

## 2018-08-28 NOTE — Telephone Encounter (Signed)
Patient is cancelling 6 month fu for right now.  States she does have an upcoming appt at [facility] to get blood work.  She also states she lost her mother in february.  Patient will call back to schedule.

## 2018-09-18 ENCOUNTER — Ambulatory Visit: Payer: Medicare Other | Admitting: Internal Medicine

## 2018-09-22 ENCOUNTER — Encounter (HOSPITAL_COMMUNITY): Payer: Self-pay

## 2018-09-22 ENCOUNTER — Emergency Department (HOSPITAL_COMMUNITY)
Admission: EM | Admit: 2018-09-22 | Discharge: 2018-09-22 | Disposition: A | Discharge status: Home / Self Care | Payer: Medicare Other | Attending: Emergency Medicine | Admitting: Emergency Medicine

## 2018-09-22 ENCOUNTER — Other Ambulatory Visit: Payer: Self-pay

## 2018-09-22 DIAGNOSIS — Z7901 Long term (current) use of anticoagulants: Secondary | ICD-10-CM | POA: Insufficient documentation

## 2018-09-22 DIAGNOSIS — Z79899 Other long term (current) drug therapy: Secondary | ICD-10-CM | POA: Diagnosis not present

## 2018-09-22 DIAGNOSIS — R040 Epistaxis: Secondary | ICD-10-CM | POA: Insufficient documentation

## 2018-09-22 MED ORDER — OXYMETAZOLINE HCL 0.05 % NA SOLN
1.0000 | Freq: Once | NASAL | Status: AC
  Administered 2018-09-22: 1 via NASAL
  Filled 2018-09-22: qty 30

## 2018-09-22 NOTE — ED Triage Notes (Signed)
Patient has a right nare nosebleed since 1800 today. Patient is on Coumadin. Patient states her INR 2.4

## 2018-09-22 NOTE — Discharge Instructions (Addendum)
Use afrin as needed as dicussed.

## 2018-09-22 NOTE — ED Provider Notes (Signed)
[facility]-EMERGENCY DEPT Provider Note   CSN: [id number] Arrival date & time: 09/22/18  1901    History   Chief Complaint Chief Complaint  Patient presents with  . Epistaxis    HPI Kathleen Lin is a 73 y.o. female.     The history is provided by the patient.  Epistaxis  Location:  R nare Severity:  Mild Duration:  1 hour Timing:  Intermittent Progression:  Waxing and waning Chronicity:  New Context: anticoagulants (on coumadin)   Relieved by:  Nothing Worsened by:  Nothing Associated symptoms: no blood in oropharynx, no cough, no dizziness, no fever and no sore throat   Risk factors: no frequent nosebleeds     Past Medical History:  Diagnosis Date  . Antiphospholipid antibody syndrome (HCC)   . Hypercholesteremia   . Migraines   . Polycythemia   . Stroke [facility](HCC) [phone]    Patient Active Problem List   Diagnosis Date Noted  . Urinary frequency 04/07/2018  . Acute non-recurrent maxillary sinusitis 04/07/2018  . Insomnia 11/19/2016  . Osteopenia 01/19/2016  . Renal insufficiency 12/20/2015  . Hyperglycemia 12/20/2015  . Anxiety 05/21/2015  . ADD (attention deficit disorder) 05/21/2015  . Overactive bladder 05/20/2015  . Long term current use of anticoagulant 07/02/2012  . Antiphospholipid syndrome (HCC) 11/07/2011  . Hypercholesterolemia 11/07/2011  . Headache, migraine 11/07/2011  . Polycythemia 11/07/2011  . Depression 07/18/2007  . Cerebral artery occlusion with cerebral infarction (HCC) 07/18/2007  . GERD 07/18/2007  . HEMORRHOIDS, HX OF 07/18/2007    Past Surgical History:  Procedure Laterality Date  . ABDOMINAL HYSTERECTOMY    . NASAL SEPTUM SURGERY       OB History   No obstetric history on file.      Home Medications    Prior to Admission medications   Medication Sig Start Date End Date Taking? Authorizing Provider  atomoxetine (STRATTERA) 100 MG capsule Take 1 capsule (100 mg total) by mouth daily. 06/26/18    Cottle, Steva Readyarey G Jr., MD  Azelastine-Fluticasone [facility](DYMISTA) [phone] MCG/ACT SUSP Place into the nose as needed. 02/12/18   Pincus SanesBurns, Stacy J, MD  calcium carbonate (CALCIUM 600) 600 MG TABS tablet Take 1 tablet (600 mg total) by mouth daily with breakfast. Patient not taking: Reported on 06/26/2018 05/21/16   Pincus SanesBurns, Stacy J, MD  Cholecalciferol (VITAMIN D3) 2000 units capsule Take 1 capsule (2,000 Units total) by mouth daily. 05/21/16   Pincus SanesBurns, Stacy J, MD  cycloSPORINE (RESTASIS) 0.05 % ophthalmic emulsion 1 drop 2 (two) times daily.    [provider]  LORazepam (ATIVAN) 0.5 MG tablet Take 0.5 mg by mouth as needed for anxiety.    [provider]  MYRBETRIQ 50 MG TB24 tablet Take 50 mg by mouth daily. 01/30/18   [provider]  nitrofurantoin, macrocrystal-monohydrate, (MACROBID) 100 MG capsule Take 1 capsule (100 mg total) by mouth 2 (two) times daily. Patient not taking: Reported on 06/26/2018 04/09/18   Pincus SanesBurns, Stacy J, MD  Propylene Glycol-Glycerin (SOOTHE) 0.6-0.6 % SOLN Apply 1 drop to eye daily. 05/21/16   Pincus SanesBurns, Stacy J, MD  RABEprazole (ACIPHEX) 20 MG tablet Take 1 tablet (20 mg total) by mouth daily as needed. 07/28/18   Pincus SanesBurns, Stacy J, MD  sertraline (ZOLOFT) 100 MG tablet TAKE 1 TABLET BY MOUTH EVERY DAY 08/25/18   Cottle, Steva Readyarey G Jr., MD  simvastatin (ZOCOR) 80 MG tablet TAKE 1 TABLET BY MOUTH EVERY DAY 03/10/18   Pincus SanesBurns, Stacy J, MD  traZODone (  DESYREL) 50 MG tablet 1-2 tablets as needed for sleep 06/26/18   Cottle, Steva Ready., MD  triamcinolone ointment (KENALOG) 0.1 % Apply twice daily 10/25/16   [provider]  warfarin (COUMADIN) 5 MG tablet Take 5 mg by mouth as directed. INR should be 2.5-3.5    [provider]    Family History Family History  Problem Relation Age of Onset  . Hypertension Mother   . Dementia Mother   . Hearing loss Mother   . Heart disease Father   . Hyperlipidemia Father   . Miscarriages / Stillbirths Maternal Grandfather    . Non-Hodgkin's lymphoma Brother     Social History Social History   Tobacco Use  . Smoking status: Never Smoker  . Smokeless tobacco: Never Used  Substance Use Topics  . Alcohol use: Never    Frequency: Never  . Drug use: No     Allergies   Patient has no known allergies.   Review of Systems Review of Systems  Constitutional: Negative for chills and fever.  HENT: Positive for nosebleeds. Negative for ear pain, postnasal drip and sore throat.   Eyes: Negative for pain and visual disturbance.  Respiratory: Negative for cough and shortness of breath.   Cardiovascular: Negative for chest pain and palpitations.  Gastrointestinal: Negative for abdominal pain and vomiting.  Genitourinary: Negative for dysuria and hematuria.  Musculoskeletal: Negative for arthralgias and back pain.  Skin: Negative for color change and rash.  Neurological: Negative for dizziness, seizures and syncope.  All other systems reviewed and are negative.    Physical Exam Updated Vital Signs BP (!) 103/50   Pulse 84   Temp 98.3 F (36.8 C) (Oral)   Resp 17   Ht  (1.676 m)   Wt 68 kg   SpO2 98%   BMI 24.21 kg/m   Physical Exam Vitals signs and nursing note reviewed.  Constitutional:      General: She is not in acute distress.    Appearance: She is well-developed.  HENT:     Head: Normocephalic and atraumatic.     Comments: Blood from right nare, no obvious source, no posterior bleed, oropharynx clear Eyes:     Conjunctiva/sclera: Conjunctivae normal.  Neck:     Musculoskeletal: Neck supple.  Cardiovascular:     Rate and Rhythm: Normal rate and regular rhythm.     Heart sounds: No murmur.  Pulmonary:     Effort: Pulmonary effort is normal. No respiratory distress.     Breath sounds: Normal breath sounds.  Abdominal:     Palpations: Abdomen is soft.     Tenderness: There is no abdominal tenderness.  Skin:    General: Skin is warm and dry.  Neurological:     Mental Status:  She is alert.      ED Treatments / Results  Labs (all labs ordered are listed, but only abnormal results are displayed) Labs Reviewed - No data to display  EKG None  Radiology No results found.  Procedures Procedures (including critical care time)  Medications Ordered in ED Medications  oxymetazoline (AFRIN) 0.05 % nasal spray 1 spray (1 spray Each Nare Given 09/22/18 2030)     Initial Impression / Assessment and Plan / ED Course  I have reviewed the triage vital signs and the nursing notes.  Pertinent labs & imaging results that were available during my care of the patient were reviewed by me and considered in my medical decision making (see chart for details).  Kathleen Lin is a 73 year old female who presents to the ED with nosebleed.  Patient is on Coumadin.  INR was 2.4 about 2 days ago.  Patient with slow bleed to the right nare.  There is no posterior bleeding.  Afrin was placed and patient held pressure for 20 minutes and bleeding stopped.  Was observed for 25 to 30 minutes after bleeding stopped with no issues.  Was discharged from the ED in good condition.  Sent home with Afrin and educated about using Afrin and pressure.  Given return precautions.  This chart was dictated using voice recognition software.  Despite best efforts to proofread,  errors can occur which can change the documentation meaning.    Final Clinical Impressions(s) / ED Diagnoses   Final diagnoses:  Epistaxis    ED Discharge Orders    None       Virgina Norfolk, DO 09/22/18 2105

## 2018-11-02 NOTE — Patient Instructions (Addendum)
  Tests ordered today. Your results will be released to [facility] (or called to you) after review.  If any changes need to be made, you will be notified at that same time.  I would recommend talking to a therapist  - discuss this with Dr Clovis Pu.     Dermatology:  [facility]  [address] --  [phone]     Medications reviewed and updated.  Changes include :   none  Your prescription(s) have been submitted to your pharmacy. Please take as directed and contact our office if you believe you are having problem(s) with the medication(s).   Please followup in 6 months

## 2018-11-02 NOTE — Progress Notes (Signed)
Subjective:    Patient ID: Kathleen AbbottKaren S Lin, female    DOB: 06/28/1945, 73 y.o.   MRN: [id number]  HPI The patient is here for follow up.  Her mom died in Feb 2020 -  They were very close and she is having a hard time with it.   She is not exercising regularly.     Hyperlipidemia: She is taking her medication daily. She is compliant with a low fat/cholesterol diet. She denies myalgias.   GERD:  She is taking her medication daily as prescribed.  She denies any GERD symptoms and feels her GERD is well controlled.   Insomnia:  She takes the trazodone nightly. Her sleep is well controlled.   Anxiety, depression:  She follows with Dr Jennelle Humanottle.  She feels her anxiety and depression are controlled under the circumstances.  She is not seeing a therapist.  .    Hyperglycemia:  She is fairly compliant with a low sugar/carbohydrate diet.  She is not exercising regularly.  Osteopenia:  Her dexa is due.  She is not exercising.  She is taking vitamin d   Medications and allergies reviewed with patient and updated if appropriate.  Patient Active Problem List   Diagnosis Date Noted  . Urinary frequency 04/07/2018  . Insomnia 11/19/2016  . Osteopenia 01/19/2016  . Renal insufficiency 12/20/2015  . Hyperglycemia 12/20/2015  . Anxiety 05/21/2015  . ADD (attention deficit disorder) 05/21/2015  . Overactive bladder 05/20/2015  . Long term current use of anticoagulant 07/02/2012  . Antiphospholipid syndrome (HCC) 11/07/2011  . Hypercholesterolemia 11/07/2011  . Headache, migraine 11/07/2011  . Depression 07/18/2007  . Cerebral artery occlusion with cerebral infarction (HCC) 07/18/2007  . GERD 07/18/2007  . HEMORRHOIDS, HX OF 07/18/2007    Current Outpatient Medications on File Prior to Visit  Medication Sig Dispense Refill  . atomoxetine (STRATTERA) 100 MG capsule Take 1 capsule (100 mg total) by mouth daily. 90 capsule 1  . Cholecalciferol (VITAMIN D3) 2000 units capsule Take 1 capsule  (2,000 Units total) by mouth daily.    . cycloSPORINE (RESTASIS) 0.05 % ophthalmic emulsion 1 drop 2 (two) times daily.    Marland Kitchen. LORazepam (ATIVAN) 0.5 MG tablet Take 0.5 mg by mouth as needed for anxiety.    Marland Kitchen. MYRBETRIQ 50 MG TB24 tablet Take 25 mg by mouth daily.   6  . Propylene Glycol-Glycerin (SOOTHE) 0.6-0.6 % SOLN Apply 1 drop to eye daily.    . RABEprazole (ACIPHEX) 20 MG tablet Take 1 tablet (20 mg total) by mouth daily as needed. 30 tablet 3  . sertraline (ZOLOFT) 100 MG tablet TAKE 1 TABLET BY MOUTH EVERY DAY 90 tablet 0  . simvastatin (ZOCOR) 80 MG tablet TAKE 1 TABLET BY MOUTH EVERY DAY 90 tablet 1  . traZODone (DESYREL) 50 MG tablet 1-2 tablets as needed for sleep 180 tablet 1  . triamcinolone ointment (KENALOG) 0.1 % Apply twice daily    . warfarin (JANTOVEN) 5 MG tablet Take 5 mg by mouth daily.     No current facility-administered medications on file prior to visit.     Past Medical History:  Diagnosis Date  . Antiphospholipid antibody syndrome (HCC)   . Hypercholesteremia   . Migraines   . Polycythemia   . Stroke [facility](HCC) [id number]) [id number]    Past Surgical History:  Procedure Laterality Date  . ABDOMINAL HYSTERECTOMY    . NASAL SEPTUM SURGERY      Social History   Socioeconomic History  . Marital status: Widowed  Spouse name: Not on file  . Number of children: Not on file  . Years of education: Not on file  . Highest education level: Not on file  Occupational History  . Not on file  Social Needs  . Financial resource strain: Not on file  . Food insecurity    Worry: Not on file    Inability: Not on file  . Transportation needs    Medical: Not on file    Non-medical: Not on file  Tobacco Use  . Smoking status: Never Smoker  . Smokeless tobacco: Never Used  Substance and Sexual Activity  . Alcohol use: Never    Frequency: Never  . Drug use: No  . Sexual activity: Not on file  Lifestyle  . Physical activity    Days per week: Not on file    Minutes per  session: Not on file  . Stress: Not on file  Relationships  . Social Herbalist on phone: Not on file    Gets together: Not on file    Attends religious service: Not on file    Active member of club or organization: Not on file    Attends meetings of clubs or organizations: Not on file    Relationship status: Not on file  Other Topics Concern  . Not on file  Social History Narrative   Not exercising regularly    Family History  Problem Relation Age of Onset  . Hypertension Mother   . Dementia Mother   . Hearing loss Mother   . Heart disease Father   . Hyperlipidemia Father   . Miscarriages / Stillbirths Maternal Grandfather   . Non-Hodgkin's lymphoma Brother     Review of Systems  Constitutional: Negative for chills and fever.  Respiratory: Positive for cough (dry - allergy related). Negative for shortness of breath and wheezing.   Cardiovascular: Negative for chest pain, palpitations and leg swelling.  Gastrointestinal:       Jerrye Bushy controlled  Neurological: Positive for dizziness (occ). Negative for light-headedness and headaches.  Psychiatric/Behavioral: Positive for dysphoric mood and sleep disturbance. The patient is nervous/anxious.        Objective:   Vitals:   11/03/18 0836  BP: 132/80  Pulse: 76  Resp: 16  Temp: 98.6 F (37 C)  SpO2: 99%   BP Readings from Last 3 Encounters:  11/03/18 132/80  09/22/18 (!) 103/50  04/07/18 102/72   Wt Readings from Last 3 Encounters:  11/03/18 158 lb (71.7 kg)  09/22/18 150 lb (68 kg)  04/07/18 178 lb (80.7 kg)   Body mass index is 25.5 kg/m.   Physical Exam    Constitutional: Appears well-developed and well-nourished. No distress.  HENT:  Head: Normocephalic and atraumatic.  Neck: Neck supple. No tracheal deviation present. No thyromegaly present.  No cervical lymphadenopathy Cardiovascular: Normal rate, regular rhythm and normal heart sounds.   No murmur heard. No carotid bruit .  No edema  Pulmonary/Chest: Effort normal and breath sounds normal. No respiratory distress. No has no wheezes. No rales.  Skin: Skin is warm and dry. Not diaphoretic.  Psychiatric: normal mood and affect. Behavior is normal.      Assessment & Plan:    See Problem List for Assessment and Plan of chronic medical problems.

## 2018-11-03 ENCOUNTER — Other Ambulatory Visit: Payer: Self-pay

## 2018-11-03 ENCOUNTER — Ambulatory Visit (INDEPENDENT_AMBULATORY_CARE_PROVIDER_SITE_OTHER): Payer: Medicare Other | Admitting: Internal Medicine

## 2018-11-03 ENCOUNTER — Encounter: Payer: Self-pay | Admitting: Internal Medicine

## 2018-11-03 VITALS — BP 132/80 | HR 76 | Temp 98.6°F | Resp 16 | Ht 66.0 in | Wt 158.0 lb

## 2018-11-03 DIAGNOSIS — R739 Hyperglycemia, unspecified: Secondary | ICD-10-CM

## 2018-11-03 DIAGNOSIS — F329 Major depressive disorder, single episode, unspecified: Secondary | ICD-10-CM

## 2018-11-03 DIAGNOSIS — K219 Gastro-esophageal reflux disease without esophagitis: Secondary | ICD-10-CM

## 2018-11-03 DIAGNOSIS — F988 Other specified behavioral and emotional disorders with onset usually occurring in childhood and adolescence: Secondary | ICD-10-CM

## 2018-11-03 DIAGNOSIS — G4709 Other insomnia: Secondary | ICD-10-CM | POA: Diagnosis not present

## 2018-11-03 DIAGNOSIS — M85862 Other specified disorders of bone density and structure, left lower leg: Secondary | ICD-10-CM

## 2018-11-03 DIAGNOSIS — F419 Anxiety disorder, unspecified: Secondary | ICD-10-CM

## 2018-11-03 DIAGNOSIS — E7800 Pure hypercholesterolemia, unspecified: Secondary | ICD-10-CM

## 2018-11-03 DIAGNOSIS — M85861 Other specified disorders of bone density and structure, right lower leg: Secondary | ICD-10-CM

## 2018-11-03 DIAGNOSIS — F32A Depression, unspecified: Secondary | ICD-10-CM

## 2018-11-03 DIAGNOSIS — D6861 Antiphospholipid syndrome: Secondary | ICD-10-CM

## 2018-11-03 MED ORDER — AZELASTINE-FLUTICASONE 137-50 MCG/ACT NA SUSP: NASAL | 8 refills | Status: AC

## 2018-11-03 NOTE — Assessment & Plan Note (Signed)
Controlled, stable Continue current dose of medication Managed by pscyh - Dr Clovis Pu

## 2018-11-03 NOTE — Assessment & Plan Note (Signed)
On warfarin - managed by Dr Dionne Milo at [facility]

## 2018-11-03 NOTE — Assessment & Plan Note (Signed)
Check lipid panel  Continue daily statin Regular exercise and healthy diet encouraged  

## 2018-11-03 NOTE — Assessment & Plan Note (Signed)
Controlled, stable Managed by pscyh - Dr Clovis Pu

## 2018-11-03 NOTE — Assessment & Plan Note (Signed)
Not exercising dexa due - deferred for now Start walking Continue vitamin d

## 2018-11-03 NOTE — Assessment & Plan Note (Signed)
Taking trazodone  - which is effective No side effects Managed by pscyh - Dr Clovis Pu

## 2018-11-03 NOTE — Assessment & Plan Note (Signed)
Per psych 

## 2018-11-03 NOTE — Assessment & Plan Note (Signed)
GERD controlled Continue daily medication  

## 2018-11-03 NOTE — Assessment & Plan Note (Signed)
Check a1c Low sugar / carb diet Stressed regular exercise   

## 2018-11-16 ENCOUNTER — Other Ambulatory Visit: Payer: Self-pay | Admitting: Psychiatry

## 2018-12-12 ENCOUNTER — Other Ambulatory Visit: Payer: Self-pay

## 2018-12-12 MED ORDER — TRAZODONE HCL 50 MG PO TABS: ORAL_TABLET | ORAL | 1 refills | Status: AC

## 2018-12-25 ENCOUNTER — Ambulatory Visit: Payer: Medicare Other | Admitting: Psychiatry

## 2018-12-28 ENCOUNTER — Other Ambulatory Visit: Payer: Self-pay | Admitting: Internal Medicine

## 2019-02-06 ENCOUNTER — Other Ambulatory Visit: Payer: Self-pay | Admitting: Psychiatry

## 2019-02-06 ENCOUNTER — Ambulatory Visit: Payer: Medicare Other | Admitting: Psychiatry

## 2019-02-16 ENCOUNTER — Other Ambulatory Visit: Payer: Self-pay

## 2019-02-16 ENCOUNTER — Ambulatory Visit: Payer: Medicare Other | Admitting: Psychiatry

## 2019-02-16 ENCOUNTER — Encounter: Payer: Self-pay | Admitting: Psychiatry

## 2019-02-16 ENCOUNTER — Ambulatory Visit (INDEPENDENT_AMBULATORY_CARE_PROVIDER_SITE_OTHER): Payer: Medicare Other | Admitting: Psychiatry

## 2019-02-16 DIAGNOSIS — F411 Generalized anxiety disorder: Secondary | ICD-10-CM | POA: Diagnosis not present

## 2019-02-16 DIAGNOSIS — F331 Major depressive disorder, recurrent, moderate: Secondary | ICD-10-CM | POA: Diagnosis not present

## 2019-02-16 DIAGNOSIS — I6930 Unspecified sequelae of cerebral infarction: Secondary | ICD-10-CM

## 2019-02-16 DIAGNOSIS — G3184 Mild cognitive impairment, so stated: Secondary | ICD-10-CM

## 2019-02-16 MED ORDER — SERTRALINE HCL 50 MG PO TABS: 150.0000 mg | ORAL_TABLET | Freq: Every day | ORAL | 0 refills | Status: DC

## 2019-02-16 NOTE — Patient Instructions (Signed)
Increase sertraline to 30 of the 50 tablets daily  OR 1 of the  100 mg tablet and 1 of the 50 mg tablets
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
## 2019-02-17 ENCOUNTER — Ambulatory Visit: Payer: Medicare Other | Admitting: Internal Medicine

## 2019-02-17 ENCOUNTER — Telehealth: Payer: Self-pay

## 2019-02-17 ENCOUNTER — Other Ambulatory Visit: Payer: Self-pay

## 2019-02-17 NOTE — Telephone Encounter (Signed)
Please send referral to Neurology.

## 2019-02-19 ENCOUNTER — Other Ambulatory Visit: Payer: Self-pay | Admitting: Psychiatry

## 2019-02-19 DIAGNOSIS — F411 Generalized anxiety disorder: Secondary | ICD-10-CM

## 2019-02-19 DIAGNOSIS — F331 Major depressive disorder, recurrent, moderate: Secondary | ICD-10-CM

## 2019-02-19 NOTE — Telephone Encounter (Signed)
Yes.  Sertraline 50 mg tablets 3 daily.  Patient has difficulty splitting tablets.

## 2019-02-19 NOTE — Telephone Encounter (Signed)
Rx submitted 10/26 for #135 take 3 daily?

## 2019-02-27 ENCOUNTER — Telehealth: Payer: Self-pay | Admitting: Internal Medicine

## 2019-02-27 ENCOUNTER — Telehealth: Payer: Self-pay | Admitting: Psychiatry

## 2019-02-27 ENCOUNTER — Emergency Department (HOSPITAL_COMMUNITY)
Admission: EM | Admit: 2019-02-27 | Discharge: 2019-02-27 | Disposition: A | Discharge status: Home / Self Care | Payer: Medicare Other | Attending: Emergency Medicine | Admitting: Emergency Medicine

## 2019-02-27 ENCOUNTER — Emergency Department (HOSPITAL_COMMUNITY): Payer: Medicare Other

## 2019-02-27 ENCOUNTER — Other Ambulatory Visit: Payer: Self-pay

## 2019-02-27 ENCOUNTER — Encounter (HOSPITAL_COMMUNITY): Payer: Self-pay | Admitting: Emergency Medicine

## 2019-02-27 DIAGNOSIS — Z8673 Personal history of transient ischemic attack (TIA), and cerebral infarction without residual deficits: Secondary | ICD-10-CM | POA: Diagnosis not present

## 2019-02-27 DIAGNOSIS — Z79899 Other long term (current) drug therapy: Secondary | ICD-10-CM | POA: Diagnosis not present

## 2019-02-27 DIAGNOSIS — R6889 Other general symptoms and signs: Secondary | ICD-10-CM | POA: Diagnosis not present

## 2019-02-27 DIAGNOSIS — R451 Restlessness and agitation: Secondary | ICD-10-CM | POA: Diagnosis not present

## 2019-02-27 DIAGNOSIS — Z7901 Long term (current) use of anticoagulants: Secondary | ICD-10-CM | POA: Insufficient documentation

## 2019-02-27 DIAGNOSIS — R4182 Altered mental status, unspecified: Secondary | ICD-10-CM | POA: Diagnosis present

## 2019-02-27 LAB — COMPREHENSIVE METABOLIC PANEL
ALT: 13 U/L (ref 0–44)
AST: 19 U/L (ref 15–41)
Albumin: 4.3 g/dL (ref 3.5–5.0)
Alkaline Phosphatase: 64 U/L (ref 38–126)
Anion gap: 11 (ref 5–15)
BUN: 24 mg/dL — ABNORMAL HIGH (ref 8–23)
CO2: 24 mmol/L (ref 22–32)
Calcium: 9.8 mg/dL (ref 8.9–10.3)
Chloride: 103 mmol/L (ref 98–111)
Creatinine, Ser: 1.48 mg/dL — ABNORMAL HIGH (ref 0.44–1.00)
GFR calc Af Amer: 41 mL/min — ABNORMAL LOW (ref >60)
GFR calc non Af Amer: 35 mL/min — ABNORMAL LOW (ref >60)
Glucose, Bld: 113 mg/dL — ABNORMAL HIGH (ref 70–99)
Potassium: 4.3 mmol/L (ref 3.5–5.1)
Sodium: 138 mmol/L (ref 135–145)
Total Bilirubin: 1.2 mg/dL (ref 0.3–1.2)
Total Protein: 8.1 g/dL (ref 6.5–8.1)

## 2019-02-27 LAB — CBC
HCT: 51 % — ABNORMAL HIGH (ref 36.0–46.0)
Hemoglobin: 16.4 g/dL — ABNORMAL HIGH (ref 12.0–15.0)
MCH: 27.8 pg (ref 26.0–34.0)
MCHC: 32.2 g/dL (ref 30.0–36.0)
MCV: 86.4 fL (ref 80.0–100.0)
Platelets: 142 10*3/uL — ABNORMAL LOW (ref 150–400)
RBC: 5.9 MIL/uL — ABNORMAL HIGH (ref 3.87–5.11)
RDW: 13.3 % (ref 11.5–15.5)
WBC: 11.4 10*3/uL — ABNORMAL HIGH (ref 4.0–10.5)
nRBC: 0 % (ref 0.0–0.2)

## 2019-02-27 LAB — PROTIME-INR
INR: 3.2 — ABNORMAL HIGH (ref 0.8–1.2)
Prothrombin Time: 32.5 seconds — ABNORMAL HIGH (ref 11.4–15.2)

## 2019-02-27 LAB — CBG MONITORING, ED
Glucose-Capillary: 103 mg/dL — ABNORMAL HIGH (ref 70–99)
Glucose-Capillary: 98 mg/dL (ref 70–99)

## 2019-02-27 MED ORDER — SODIUM CHLORIDE 0.9% FLUSH: 3.0000 mL | Freq: Once | INTRAVENOUS | Status: DC

## 2019-02-27 NOTE — Telephone Encounter (Signed)
RTC  Otho Najjar NP coumadin provider.  Pt more confused lately.  Not understanding coumadin dosing and calling back.  Not remembering whether she took her meds.  Forgetting she called and called their office 3 times today.  Last INR on Friday.  Only recent psych med change was on on October 26 with an increase of sertraline from 100 mg to 150 mg daily for excessive tearfulness.  She was mildly confused at that appointment but is apparently much worse.  There have been no other recent med changes to my knowledge that would explain this degree of confusion.  She does have a prescription for lorazepam 0.5 mg but takes it rarely and has not been known to abuse it.  She is also on Myrbetriq which can have some mild cognitive impairment but has been on that same dose apparently since October 2019.  Spoke with Dr. Billey Gosling, PCP.  Based on the information all providers agree the patient needs to go the emergency room for medical work-up for acute mental status change.  Lynder Parents MD, DFAPA

## 2019-02-27 NOTE — Telephone Encounter (Signed)
Copied from [place] [id number]. Topic: General - Other >> Feb 27, 2019  1:23 PM Yvette Rack wrote: Reason for CRM: Otho Najjar with Hematology at [facility] stated she needs to speak with Dr. Quay Burow about this patient. Cb# [phone] personal cell#

## 2019-02-27 NOTE — Discharge Instructions (Signed)
Please call your primary doctor as well as your psychiatrist to schedule close follow-up appointment.  Ideally, you should be seen by your primary doctor on Monday or Tuesday of next week.  If you have any numbness, weakness, vision changes, any thoughts of hurting yourself or hurting others, please return to ER for reassessment.

## 2019-02-27 NOTE — Telephone Encounter (Signed)
See note regarding phone call to Otho Najjar nurse practitioner and Billey Gosling, MD.  We are all in agreement because patient's acute confusion that she needs to go to the emergency room immediately to be assessed for this problem.  I have left a message on both of the patient's phone numbers as well as the daughter's cell phone number who is the emergency contact.

## 2019-02-27 NOTE — ED Notes (Signed)
Patient verbalizes understanding of discharge instructions. Opportunity for questioning and answers were provided. Armband removed by staff, pt discharged from ED.  

## 2019-02-27 NOTE — Telephone Encounter (Signed)
Called Kathleen Lin on cell.  There has been an acute change in Kathleen Lin's memory over the past week - she has called the hematology office several times asking for clarification regarding her warfarin dosing and appears to be having significant memory difficulties that is acute.     Will try to reach out to Shahana to see if she can come in for an appointment.  Will also send note to Dr Clovis Pu - apparently there has been recent medication changes that ? Affect her memory.     Gareth Eagle - please call [name] and see if she will come see me asap.
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
## 2019-02-27 NOTE — ED Provider Notes (Signed)
[facility] Provider Note   CSN: [id number] Arrival date & time: 02/27/19  1749     History   Chief Complaint Chief Complaint  Patient presents with  . Altered Mental Status  . Agitation    HPI Kathleen Lin is a 73 y.o. female.  Presents to ER after her primary doctor recommended she come for further evaluation.  Patient states that she has been having trouble remembering her appointments and some of her dosages for her Coumadin.  States she thinks she has the correct dose figured out and uses a pill Therapist, [employer].  She came to the ER at the request of her psychiatrist.  Patient states that she otherwise feels like her normal self.  Denies any difficulty with vision, walking, no numbness or weakness.  States that she has been struggling with grief related to losing her mother earlier this year.  States however that she has not had any thoughts of suicide, homicidal ideations, auditory or visual hallucinations.   Additional history obtained from chart review, reviewed recent telephone notes from primary care office.  [facility] hematology office concerned about patient calling the clinic multiple times confused about dosing and concerned she is having acute memory issues.  Her primary care doctor as well as her psychiatrist discussed her care.  They referred to come to the emergency department for further eval given concern for forgetfulness and agitation.     HPI  Past Medical History:  Diagnosis Date  . Antiphospholipid antibody syndrome (HCC)   . Hypercholesteremia   . Migraines   . Polycythemia   . Stroke [facility](HCC) [phone]    Patient Active Problem List   Diagnosis Date Noted  . Urinary frequency 04/07/2018  . Insomnia 11/19/2016  . Osteopenia 01/19/2016  . Renal insufficiency 12/20/2015  . Hyperglycemia 12/20/2015  . Anxiety 05/21/2015  . ADD (attention deficit disorder) 05/21/2015  . Overactive bladder 05/20/2015  . Long term current use  of anticoagulant 07/02/2012  . Antiphospholipid syndrome (HCC) 11/07/2011  . Hypercholesterolemia 11/07/2011  . Headache, migraine 11/07/2011  . Depression 07/18/2007  . Cerebral artery occlusion with cerebral infarction (HCC) 07/18/2007  . GERD 07/18/2007  . HEMORRHOIDS, HX OF 07/18/2007    Past Surgical History:  Procedure Laterality Date  . ABDOMINAL HYSTERECTOMY    . NASAL SEPTUM SURGERY       OB History   No obstetric history on file.      Home Medications    Prior to Admission medications   Medication Sig Start Date End Date Taking? Authorizing Provider  atomoxetine (STRATTERA) 100 MG capsule TAKE 1 CAPSULE BY MOUTH EVERY DAY 02/06/19   Cottle, Steva Readyarey G Jr., MD  Azelastine-Fluticasone [facility](DYMISTA) 137-50 MCG/ACT SUSP Place into the nose as needed. 11/03/18   Pincus SanesBurns, Stacy J, MD  Cholecalciferol (VITAMIN D3) 2000 units capsule Take 1 capsule (2,000 Units total) by mouth daily. 05/21/16   Pincus SanesBurns, Stacy J, MD  cycloSPORINE (RESTASIS) 0.05 % ophthalmic emulsion 1 drop 2 (two) times daily.    [provider]  LORazepam (ATIVAN) 0.5 MG tablet Take 0.5 mg by mouth as needed for anxiety.    [provider]  MYRBETRIQ 50 MG TB24 tablet Take 25 mg by mouth daily.  01/30/18   [provider]  Propylene Glycol-Glycerin (SOOTHE) 0.6-0.6 % SOLN Apply 1 drop to eye daily. 05/21/16   Pincus SanesBurns, Stacy J, MD  RABEprazole (ACIPHEX) 20 MG tablet Take 1 tablet (20 mg total) by mouth daily as needed. 07/28/18  Pincus Sanes, MD  sertraline (ZOLOFT) 50 MG tablet TAKE 3 TABLETS BY MOUTH EVERY DAY 02/19/19   Cottle, Steva Ready., MD  simvastatin (ZOCOR) 80 MG tablet TAKE 1 TABLET BY MOUTH EVERY DAY 12/28/18   Pincus Sanes, MD  traZODone (DESYREL) 50 MG tablet 1-2 tablets as needed for sleep 12/12/18   Cottle, Steva Ready., MD  triamcinolone ointment (KENALOG) 0.1 % Apply twice daily 10/25/16   [provider]  warfarin (JANTOVEN) 5 MG tablet Take 5 mg by mouth daily.    [provider]  warfarin (JANTOVEN) 5 MG tablet Take by mouth. 09/25/18 09/25/19  [provider]    Family History Family History  Problem Relation Age of Onset  . Hypertension Mother   . Dementia Mother   . Hearing loss Mother   . Heart disease Father   . Hyperlipidemia Father   . Miscarriages / Stillbirths Maternal Grandfather   . Non-Hodgkin's lymphoma Brother     Social History Social History   Tobacco Use  . Smoking status: Never Smoker  . Smokeless tobacco: Never Used  Substance Use Topics  . Alcohol use: Never    Frequency: Never  . Drug use: No     Allergies   Patient has no known allergies.   Review of Systems Review of Systems  Constitutional: Negative for chills and fever.  HENT: Negative for ear pain and sore throat.   Eyes: Negative for pain and visual disturbance.  Respiratory: Negative for cough and shortness of breath.   Cardiovascular: Negative for chest pain and palpitations.  Gastrointestinal: Negative for abdominal pain and vomiting.  Genitourinary: Negative for dysuria and hematuria.  Musculoskeletal: Negative for arthralgias and back pain.  Skin: Negative for color change and rash.  Neurological: Negative for seizures and syncope.  All other systems reviewed and are negative.    Physical Exam Updated Vital Signs BP (!) 162/77 (BP Location: Right Arm)   Pulse 87   Temp 98.2 F (36.8 C) (Oral)   Resp (!) 22   Ht 5\' 6"  (1.676 m)   Wt 69.9 kg   SpO2 99%   BMI 24.86 kg/m   Physical Exam Vitals signs and nursing note reviewed.  Constitutional:      General: She is not in acute distress.    Appearance: She is well-developed.  HENT:     Head: Normocephalic and atraumatic.  Eyes:     Conjunctiva/sclera: Conjunctivae normal.  Neck:     Musculoskeletal: Neck supple.  Cardiovascular:     Rate and Rhythm: Normal rate and regular rhythm.     Heart sounds: No murmur.  Pulmonary:     Effort: Pulmonary effort is normal. No  respiratory distress.     Breath sounds: Normal breath sounds.  Abdominal:     Palpations: Abdomen is soft.     Tenderness: There is no abdominal tenderness.  Musculoskeletal:        General: No swelling or tenderness.  Skin:    General: Skin is warm and dry.  Neurological:     Mental Status: She is alert.     Comments: Alert, oriented x3, cranial nerves II through XII intact, normal finger-nose-finger, 5 out of 5 strength in bilateral upper and lower extremities, visual fields intact, speech clear, gait normal  Psychiatric:     Comments: Appears to be mildly anxious, but appropriate, no SI, HI, AVH      ED Treatments / Results  Labs (all labs ordered are  listed, but only abnormal results are displayed) Labs Reviewed  COMPREHENSIVE METABOLIC PANEL - Abnormal; Notable for the following components:      Result Value   Glucose, Bld 113 (*)    BUN 24 (*)    Creatinine, Ser 1.48 (*)    GFR calc non Af Amer 35 (*)    GFR calc Af Amer 41 (*)    All other components within normal limits  CBC - Abnormal; Notable for the following components:   WBC 11.4 (*)    RBC 5.90 (*)    Hemoglobin 16.4 (*)    HCT 51.0 (*)    Platelets 142 (*)    All other components within normal limits  PROTIME-INR - Abnormal; Notable for the following components:   Prothrombin Time 32.5 (*)    INR 3.2 (*)    All other components within normal limits  CBG MONITORING, ED - Abnormal; Notable for the following components:   Glucose-Capillary 103 (*)    All other components within normal limits  URINALYSIS, ROUTINE W REFLEX MICROSCOPIC  CBG MONITORING, ED    EKG None  Radiology Ct Head Wo Contrast  Result Date: 02/27/2019 CLINICAL DATA:  Altered mental status EXAM: CT HEAD WITHOUT CONTRAST TECHNIQUE: Contiguous axial images were obtained from the base of the skull through the vertex without intravenous contrast. COMPARISON:  None. FINDINGS: Brain: No evidence of acute infarction, hemorrhage,  hydrocephalus, extra-axial collection or mass lesion/mass effect. Periventricular and deep white matter hypodensity. Encephalomalacia of the right frontal lobe and left parietal lobe. Vascular: No hyperdense vessel or unexpected calcification. Skull: Normal. Negative for fracture or focal lesion. Sinuses/Orbits: No acute finding. Other: None. IMPRESSION: 1.  No acute intracranial pathology. 2. 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 Electronically Signed   By: Eddie Candle M.D.   On: 02/27/2019 18:55    Procedures Procedures (including critical care time)  Medications Ordered in ED Medications  sodium chloride flush (NS) 0.9 % injection 3 mL (has no administration in time range)     Initial Impression / Assessment and Plan / ED Course  I have reviewed the triage vital signs and the nursing notes.  Pertinent labs & imaging results that were available during my care of the patient were reviewed by me and considered in my medical decision making (see chart for details).        73 year old with past medical history antiphospholipid syndrome, stroke presents to ER after PCP and psychiatrist concerned about patient's increased forgetfulness, agitation.  Here, patient was appropriate, not agitated, no focal neuro deficits on exam.  She was oriented to person place, time, situation.  She seemed to be mildly anxious but had no SI, HI, AVH.  I recommended obtaining MRI to fully rule out stroke.  Patient states that she does not want to wait for this test to be completed and wants to go home to take care of her companion.  States that she is willing to follow-up with her doctors at [facility] and her primary care doctor.  Stressed importance of returning for any change in symptoms or mental status.  Her INR is therapeutic, states she supposed to be between 2.5 and 3.5.  She is 3.2 today.  Will dc home.   Final Clinical Impressions(s) /  ED Diagnoses   Final diagnoses:  Forgetfulness    ED Discharge Orders    None       Lucrezia Starch, MD 02/27/19  2355  

## 2019-02-27 NOTE — Telephone Encounter (Signed)
Appointment with Dr Quay Burow has been scheduled.

## 2019-02-27 NOTE — Telephone Encounter (Signed)
Was able to reach the patient on the phone.  Spoke with her boyfriend Bud who agrees to take her to the [facility], ER for assessment of acute mental status change with confusion and extreme forgetfulness and agitation.  Patient agrees to go to the emergency room voluntarily. Lynder Parents, MD, DFAPA

## 2019-03-02 ENCOUNTER — Telehealth: Payer: Self-pay | Admitting: Internal Medicine

## 2019-03-02 NOTE — Telephone Encounter (Signed)
AccessNurse Call: 02/28/2019 at 12:46pm -   Patient left a message stating that she has had a stroke and she needs to know how much Coumadin she needs to take.  RN Delana Meyer) called patient back to discuss. Patient states that she has APS and takes warfarin daily. She said that she had some blood drawn last night but she has not gotten the results yet. She did not know who ordered them but told the RN "they ordered it from [facility]". The RN advised the caller that they do not have her INR results and the results will go to whoever ordered them. Patient states that this office does not dose her warfarin, but she wants this office to call and get her results and then tell her what her dosage change is. The RN advised her that we cannot get the results of her labs because we did not order them and that the ordering physician will be the one to change her dose and call her with the results. The patient began to get upset, raised her voice, repeatedly interrupted the RN, and when the RN was unable to help the patient to her liking, the patient shouted,, "Just forget it! Forget it!" multiple times and the RN ended the call due to a verbally abusive patient.

## 2019-03-02 NOTE — Telephone Encounter (Signed)
FYI

## 2019-03-02 NOTE — Telephone Encounter (Signed)
No INR was done at her ED visit.  She follows at [facility] for her warfarin.     She has had some increased confusion recently.  No action needed at this time - she has an appointment with me this week.

## 2019-03-03 NOTE — Progress Notes (Deleted)
Subjective:    Patient ID: Kathleen Lin, female    DOB: [birth year], 73 y.o.   MRN: [id number]  HPI The patient is here for an acute visit.   Confusion, memory issues:    MRI, neuro  Medications and allergies reviewed with patient and updated if appropriate.  Patient Active Problem List   Diagnosis Date Noted  . Urinary frequency 04/07/2018  . Insomnia 11/19/2016  . Osteopenia 01/19/2016  . Renal insufficiency 12/20/2015  . Hyperglycemia 12/20/2015  . Anxiety 05/21/2015  . ADD (attention deficit disorder) 05/21/2015  . Overactive bladder 05/20/2015  . Long term current use of anticoagulant 07/02/2012  . Antiphospholipid syndrome (HCC) 11/07/2011  . Hypercholesterolemia 11/07/2011  . Headache, migraine 11/07/2011  . Depression 07/18/2007  . Cerebral artery occlusion with cerebral infarction (HCC) 07/18/2007  . GERD 07/18/2007  . HEMORRHOIDS, HX OF 07/18/2007    Current Outpatient Medications on File Prior to Visit  Medication Sig Dispense Refill  . atomoxetine (STRATTERA) 100 MG capsule TAKE 1 CAPSULE BY MOUTH EVERY DAY 90 capsule 0  . Azelastine-Fluticasone (DYMISTA) 137-50 MCG/ACT SUSP Place into the nose as needed. 23 g 8  . Cholecalciferol (VITAMIN D3) 2000 units capsule Take 1 capsule (2,000 Units total) by mouth daily.    . cycloSPORINE (RESTASIS) 0.05 % ophthalmic emulsion 1 drop 2 (two) times daily.    Marland Kitchen LORazepam (ATIVAN) 0.5 MG tablet Take 0.5 mg by mouth as needed for anxiety.    Marland Kitchen MYRBETRIQ 50 MG TB24 tablet Take 25 mg by mouth daily.   6  . Propylene Glycol-Glycerin (SOOTHE) 0.6-0.6 % SOLN Apply 1 drop to eye daily.    . RABEprazole (ACIPHEX) 20 MG tablet Take 1 tablet (20 mg total) by mouth daily as needed. 30 tablet 3  . sertraline (ZOLOFT) 50 MG tablet TAKE 3 TABLETS BY MOUTH EVERY DAY 270 tablet 1  . simvastatin (ZOCOR) 80 MG tablet TAKE 1 TABLET BY MOUTH EVERY DAY 90 tablet 1  . traZODone (DESYREL) 50 MG tablet 1-2 tablets as needed for sleep 180  tablet 1  . triamcinolone ointment (KENALOG) 0.1 % Apply twice daily    . warfarin (JANTOVEN) 5 MG tablet Take 5 mg by mouth daily.    Marland Kitchen warfarin (JANTOVEN) 5 MG tablet Take by mouth.     No current facility-administered medications on file prior to visit.     Past Medical History:  Diagnosis Date  . Antiphospholipid antibody syndrome (HCC)   . Hypercholesteremia   . Migraines   . Polycythemia   . Stroke [facility]) [id number]    Past Surgical History:  Procedure Laterality Date  . ABDOMINAL HYSTERECTOMY    . NASAL SEPTUM SURGERY      Social History   Socioeconomic History  . Marital status: Widowed    Spouse name: Not on file  . Number of children: Not on file  . Years of education: Not on file  . Highest education level: Not on file  Occupational History  . Not on file  Social Needs  . Financial resource strain: Not on file  . Food insecurity    Worry: Not on file    Inability: Not on file  . Transportation needs    Medical: Not on file    Non-medical: Not on file  Tobacco Use  . Smoking status: Never Smoker  . Smokeless tobacco: Never Used  Substance and Sexual Activity  . Alcohol use: Never    Frequency: Never  . Drug use: No  .  Sexual activity: Not on file  Lifestyle  . Physical activity    Days per week: Not on file    Minutes per session: Not on file  . Stress: Not on file  Relationships  . Social Herbalist on phone: Not on file    Gets together: Not on file    Attends religious service: Not on file    Active member of club or organization: Not on file    Attends meetings of clubs or organizations: Not on file    Relationship status: Not on file  Other Topics Concern  . Not on file  Social History Narrative   Not exercising regularly    Family History  Problem Relation Age of Onset  . Hypertension Mother   . Dementia Mother   . Hearing loss Mother   . Heart disease Father   . Hyperlipidemia Father   . Miscarriages / Stillbirths  Maternal Grandfather   . Non-Hodgkin's lymphoma Brother     Review of Systems     Objective:  There were no vitals filed for this visit. BP Readings from Last 3 Encounters:  02/27/19 (!) 167/101  11/03/18 132/80  09/22/18 (!) 103/50   Wt Readings from Last 3 Encounters:  02/27/19 154 lb (69.9 kg)  11/03/18 158 lb (71.7 kg)  09/22/18 150 lb (68 kg)   There is no height or weight on file to calculate BMI.   Physical Exam         Assessment & Plan:    See Problem List for Assessment and Plan of chronic medical problems.

## 2019-03-04 ENCOUNTER — Other Ambulatory Visit: Payer: Self-pay

## 2019-03-04 ENCOUNTER — Ambulatory Visit: Payer: Medicare Other | Admitting: Internal Medicine

## 2019-03-04 ENCOUNTER — Encounter: Payer: Self-pay | Admitting: Psychiatry

## 2019-03-04 ENCOUNTER — Ambulatory Visit (INDEPENDENT_AMBULATORY_CARE_PROVIDER_SITE_OTHER): Payer: Medicare Other | Admitting: Psychiatry

## 2019-03-04 DIAGNOSIS — F331 Major depressive disorder, recurrent, moderate: Secondary | ICD-10-CM

## 2019-03-04 DIAGNOSIS — I6930 Unspecified sequelae of cerebral infarction: Secondary | ICD-10-CM | POA: Diagnosis not present

## 2019-03-04 DIAGNOSIS — F482 Pseudobulbar affect: Secondary | ICD-10-CM | POA: Diagnosis not present

## 2019-03-04 DIAGNOSIS — F411 Generalized anxiety disorder: Secondary | ICD-10-CM

## 2019-03-04 DIAGNOSIS — G3184 Mild cognitive impairment, so stated: Secondary | ICD-10-CM

## 2019-03-04 MED ORDER — NUEDEXTA 20-10 MG PO CAPS: 1.0000 | ORAL_CAPSULE | Freq: Two times a day (BID) | ORAL | 2 refills | Status: DC

## 2019-03-04 NOTE — Patient Instructions (Signed)
Nuedexta 1 daily for 1 week then 1 in the morning and 1 at night.
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
## 2019-03-05 ENCOUNTER — Telehealth: Payer: Self-pay

## 2019-03-05 NOTE — Telephone Encounter (Signed)
Prior authorization submitted and approved for Nuedexta 20 mg-10 mg capsule effective 02/02/2019-03/03/2020 through [place], FR#[id number] [facility] notified of approval

## 2019-03-10 ENCOUNTER — Telehealth: Payer: Self-pay | Admitting: Psychiatry

## 2019-03-10 NOTE — Telephone Encounter (Signed)
Daughter, Leveda Anna, called wanting to talk with Dr.Cottle about her mother, Kathleen Lin.  She wanted to discuss behavior/actions.  Said she had talked with you a couple of weeks ago, and wanted to follow up.  [name] was just in 03/04/19.  Next appt 04/14/19. Please call to discuss - [phone]

## 2019-03-11 NOTE — Telephone Encounter (Signed)
RTC to D Jodi  Disc concerns re: pt's cognitive status.  Pt has to take a test with DMV. Disc the ER visit and recommendation for MRI to RO new stroke. Disc addition of Nudexta for emotional lability.  Likely pt will eventually need assistance based on cognitive limitations.  Disc potential next steps for care if she continues to decline.  Lynder Parents, MD, DFAPA

## 2019-04-05 NOTE — Progress Notes (Deleted)
Subjective:    Patient ID: Kathleen Lin, female    DOB: 1945/10/19, 73 y.o.   MRN: [id number]  HPI The patient is here for follow up.  She is exercising regularly.    Hyperlipidemia: She is taking her medication daily. She is compliant with a low fat/cholesterol diet. She denies myalgias.   GERD:  She is taking her medication daily as prescribed.  She denies any GERD symptoms and feels her GERD is well controlled.   Insomnia:  She takes trazodone.  Hyperglycemia:  CKD:  Anxiety, Depression: She is taking her medication daily as prescribed. She denies any side effects from the medication. She feels her depression and anxiety are well controlled and she is happy with her current dose of medication.   Episodes of confusion:     Medications and allergies reviewed with patient and updated if appropriate.  Patient Active Problem List   Diagnosis Date Noted  . Urinary frequency 04/07/2018  . Insomnia 11/19/2016  . Osteopenia 01/19/2016  . Renal insufficiency 12/20/2015  . Hyperglycemia 12/20/2015  . Anxiety 05/21/2015  . ADD (attention deficit disorder) 05/21/2015  . Overactive bladder 05/20/2015  . Long term current use of anticoagulant 07/02/2012  . Antiphospholipid syndrome (HCC) 11/07/2011  . Hypercholesterolemia 11/07/2011  . Headache, migraine 11/07/2011  . Depression 07/18/2007  . Cerebral artery occlusion with cerebral infarction (HCC) 07/18/2007  . GERD 07/18/2007  . HEMORRHOIDS, HX OF 07/18/2007    Current Outpatient Medications on File Prior to Visit  Medication Sig Dispense Refill  . atomoxetine (STRATTERA) 100 MG capsule TAKE 1 CAPSULE BY MOUTH EVERY DAY 90 capsule 0  . Azelastine-Fluticasone (DYMISTA) 137-50 MCG/ACT SUSP Place into the nose as needed. 23 g 8  . Cholecalciferol (VITAMIN D3) 2000 units capsule Take 1 capsule (2,000 Units total) by mouth daily.    . cycloSPORINE (RESTASIS) 0.05 % ophthalmic emulsion 1 drop 2 (two) times daily.    Marland Kitchen  Dextromethorphan-quiNIDine (NUEDEXTA) 20-10 MG capsule Take 1 capsule by mouth 2 (two) times daily. 60 capsule 2  . LORazepam (ATIVAN) 0.5 MG tablet Take 0.5 mg by mouth as needed for anxiety.    Marland Kitchen MYRBETRIQ 50 MG TB24 tablet Take 25 mg by mouth daily.   6  . Propylene Glycol-Glycerin (SOOTHE) 0.6-0.6 % SOLN Apply 1 drop to eye daily.    . RABEprazole (ACIPHEX) 20 MG tablet Take 1 tablet (20 mg total) by mouth daily as needed. 30 tablet 3  . sertraline (ZOLOFT) 50 MG tablet TAKE 3 TABLETS BY MOUTH EVERY DAY (Patient not taking: Reported on 03/04/2019) 270 tablet 1  . simvastatin (ZOCOR) 80 MG tablet TAKE 1 TABLET BY MOUTH EVERY DAY 90 tablet 1  . traZODone (DESYREL) 50 MG tablet 1-2 tablets as needed for sleep 180 tablet 1  . triamcinolone ointment (KENALOG) 0.1 % Apply twice daily    . warfarin (JANTOVEN) 5 MG tablet Take 5 mg by mouth daily.    Marland Kitchen warfarin (JANTOVEN) 5 MG tablet Take by mouth.     No current facility-administered medications on file prior to visit.    Past Medical History:  Diagnosis Date  . Antiphospholipid antibody syndrome (HCC)   . Hypercholesteremia   . Migraines   . Polycythemia   . Stroke [facility]) [id number]    Past Surgical History:  Procedure Laterality Date  . ABDOMINAL HYSTERECTOMY    . NASAL SEPTUM SURGERY      Social History   Socioeconomic History  . Marital status: Widowed  Spouse name: Not on file  . Number of children: Not on file  . Years of education: Not on file  . Highest education level: Not on file  Occupational History  . Not on file  Tobacco Use  . Smoking status: Never Smoker  . Smokeless tobacco: Never Used  Substance and Sexual Activity  . Alcohol use: Never  . Drug use: No  . Sexual activity: Not on file  Other Topics Concern  . Not on file  Social History Narrative   Not exercising regularly   Social Determinants of Health   Financial Resource Strain:   . Difficulty of Paying Living Expenses: Not on file  Food  Insecurity:   . Worried About Charity fundraiser in the Last Year: Not on file  . Ran Out of Food in the Last Year: Not on file  Transportation Needs:   . Lack of Transportation (Medical): Not on file  . Lack of Transportation (Non-Medical): Not on file  Physical Activity:   . Days of Exercise per Week: Not on file  . Minutes of Exercise per Session: Not on file  Stress:   . Feeling of Stress : Not on file  Social Connections:   . Frequency of Communication with Friends and Family: Not on file  . Frequency of Social Gatherings with Friends and Family: Not on file  . Attends Religious Services: Not on file  . Active Member of Clubs or Organizations: Not on file  . Attends Archivist Meetings: Not on file  . Marital Status: Not on file    Family History  Problem Relation Age of Onset  . Hypertension Mother   . Dementia Mother   . Hearing loss Mother   . Heart disease Father   . Hyperlipidemia Father   . Miscarriages / Stillbirths Maternal Grandfather   . Non-Hodgkin's lymphoma Brother     Review of Systems     Objective:  There were no vitals filed for this visit. BP Readings from Last 3 Encounters:  02/27/19 (!) 167/101  11/03/18 132/80  09/22/18 (!) 103/50   Wt Readings from Last 3 Encounters:  02/27/19 154 lb (69.9 kg)  11/03/18 158 lb (71.7 kg)  09/22/18 150 lb (68 kg)   There is no height or weight on file to calculate BMI.   Physical Exam    Constitutional: Appears well-developed and well-nourished. No distress.  HENT:  Head: Normocephalic and atraumatic.  Neck: Neck supple. No tracheal deviation present. No thyromegaly present.  No cervical lymphadenopathy Cardiovascular: Normal rate, regular rhythm and normal heart sounds.   No murmur heard. No carotid bruit .  No edema Pulmonary/Chest: Effort normal and breath sounds normal. No respiratory distress. No has no wheezes. No rales.  Skin: Skin is warm and dry. Not diaphoretic.  Psychiatric:  Normal mood and affect. Behavior is normal.      Assessment & Plan:    See Problem List for Assessment and Plan of chronic medical problems.    This visit occurred during the SARS-CoV-2 public health emergency.  Safety protocols were in place, including screening questions prior to the visit, additional usage of staff PPE, and extensive cleaning of exam room while observing appropriate contact time as indicated for disinfecting solutions.

## 2019-04-06 ENCOUNTER — Other Ambulatory Visit: Payer: Self-pay

## 2019-04-06 ENCOUNTER — Telehealth: Payer: Self-pay

## 2019-04-06 ENCOUNTER — Ambulatory Visit (INDEPENDENT_AMBULATORY_CARE_PROVIDER_SITE_OTHER): Payer: Medicare Other | Admitting: Internal Medicine

## 2019-04-06 ENCOUNTER — Ambulatory Visit: Payer: Medicare Other | Admitting: Internal Medicine

## 2019-04-06 ENCOUNTER — Encounter: Payer: Self-pay | Admitting: Internal Medicine

## 2019-04-06 ENCOUNTER — Other Ambulatory Visit (INDEPENDENT_AMBULATORY_CARE_PROVIDER_SITE_OTHER): Payer: Medicare Other

## 2019-04-06 VITALS — BP 138/88 | HR 85 | Temp 98.2°F | Resp 16 | Ht 66.0 in | Wt 181.0 lb

## 2019-04-06 DIAGNOSIS — N289 Disorder of kidney and ureter, unspecified: Secondary | ICD-10-CM | POA: Diagnosis not present

## 2019-04-06 DIAGNOSIS — E7800 Pure hypercholesterolemia, unspecified: Secondary | ICD-10-CM

## 2019-04-06 DIAGNOSIS — F329 Major depressive disorder, single episode, unspecified: Secondary | ICD-10-CM

## 2019-04-06 DIAGNOSIS — R410 Disorientation, unspecified: Secondary | ICD-10-CM

## 2019-04-06 DIAGNOSIS — F419 Anxiety disorder, unspecified: Secondary | ICD-10-CM

## 2019-04-06 DIAGNOSIS — F32A Depression, unspecified: Secondary | ICD-10-CM

## 2019-04-06 DIAGNOSIS — R4189 Other symptoms and signs involving cognitive functions and awareness: Secondary | ICD-10-CM

## 2019-04-06 DIAGNOSIS — K219 Gastro-esophageal reflux disease without esophagitis: Secondary | ICD-10-CM

## 2019-04-06 DIAGNOSIS — R739 Hyperglycemia, unspecified: Secondary | ICD-10-CM | POA: Diagnosis not present

## 2019-04-06 LAB — CBC WITH DIFFERENTIAL/PLATELET
Basophils Absolute: 0.1 10*3/uL (ref 0.0–0.1)
Basophils Relative: 0.8 % (ref 0.0–3.0)
Eosinophils Absolute: 0 10*3/uL (ref 0.0–0.7)
Eosinophils Relative: 0.5 % (ref 0.0–5.0)
HCT: 47.8 % — ABNORMAL HIGH (ref 36.0–46.0)
Hemoglobin: 15.8 g/dL — ABNORMAL HIGH (ref 12.0–15.0)
Lymphocytes Relative: 12.6 % (ref 12.0–46.0)
Lymphs Abs: 1.2 10*3/uL (ref 0.7–4.0)
MCHC: 33.1 g/dL (ref 30.0–36.0)
MCV: 84.4 fl (ref 78.0–100.0)
Monocytes Absolute: 0.9 10*3/uL (ref 0.1–1.0)
Monocytes Relative: 9.4 % (ref 3.0–12.0)
Neutro Abs: 7.2 10*3/uL (ref 1.4–7.7)
Neutrophils Relative %: 76.7 % (ref 43.0–77.0)
Platelets: 142 10*3/uL — ABNORMAL LOW (ref 150.0–400.0)
RBC: 5.66 Mil/uL — ABNORMAL HIGH (ref 3.87–5.11)
RDW: 14.4 % (ref 11.5–15.5)
WBC: 9.3 10*3/uL (ref 4.0–10.5)

## 2019-04-06 LAB — COMPREHENSIVE METABOLIC PANEL
ALT: 7 U/L (ref 0–35)
AST: 15 U/L (ref 0–37)
Albumin: 4.3 g/dL (ref 3.5–5.2)
Alkaline Phosphatase: 71 U/L (ref 39–117)
BUN: 19 mg/dL (ref 6–23)
CO2: 29 mEq/L (ref 19–32)
Calcium: 9.6 mg/dL (ref 8.4–10.5)
Chloride: 102 mEq/L (ref 96–112)
Creatinine, Ser: 1.15 mg/dL (ref 0.40–1.20)
GFR: 46.49 mL/min — ABNORMAL LOW (ref >60.00)
Glucose, Bld: 84 mg/dL (ref 70–99)
Potassium: 4.1 mEq/L (ref 3.5–5.1)
Sodium: 137 mEq/L (ref 135–145)
Total Bilirubin: 0.7 mg/dL (ref 0.2–1.2)
Total Protein: 7.8 g/dL (ref 6.0–8.3)

## 2019-04-06 LAB — LIPID PANEL
Cholesterol: 145 mg/dL (ref 0–200)
HDL: 37.6 mg/dL — ABNORMAL LOW (ref >39.00)
LDL Cholesterol: 94 mg/dL (ref 0–99)
NonHDL: 107.72
Total CHOL/HDL Ratio: 4
Triglycerides: 68 mg/dL (ref 0.0–149.0)
VLDL: 13.6 mg/dL (ref 0.0–40.0)

## 2019-04-06 LAB — HEMOGLOBIN A1C: Hgb A1c MFr Bld: 5.5 % (ref 4.6–6.5)

## 2019-04-06 LAB — TSH: TSH: 3 u[IU]/mL (ref 0.35–4.50)

## 2019-04-06 NOTE — Assessment & Plan Note (Signed)
CMP

## 2019-04-06 NOTE — Patient Instructions (Signed)
  Tests ordered today. Your results will be released to [facility] (or called to you) after review.  If any changes need to be made, you will be notified at that same time.   Medications reviewed and updated.  Changes include :   none    A referral was ordered for neurology.  Please followup in 6 months

## 2019-04-06 NOTE — Assessment & Plan Note (Addendum)
Check lipid panel, CMP Continue daily statin Regular exercise and healthy diet encouraged  

## 2019-04-06 NOTE — Telephone Encounter (Signed)
Copied from [place] [id number]. Topic: General - Inquiry >> Apr 06, 2019  1:18 PM Kathleen Lin wrote: Reason for CRM:   Pt wants to know if her blood work is back yet.  Pt also states that she brought in some papers from the [place] for her appointment and states that Dr. Quay Burow kept some of them (pages 5-8) - states she needs them back to be turned in, states they should have gone in the mail today.

## 2019-04-06 NOTE — Assessment & Plan Note (Signed)
She feels her depression is controlled Management per psychiatry

## 2019-04-06 NOTE — Assessment & Plan Note (Signed)
GERD controlled Continue daily medication  

## 2019-04-06 NOTE — Telephone Encounter (Signed)
Explained to pt that Dr. Quay Burow needs time to look over form and lab work and I will let her know when it is completed.

## 2019-04-06 NOTE — Assessment & Plan Note (Signed)
She feels her anxiety is controlled Management per psychiatry
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
## 2019-04-06 NOTE — Assessment & Plan Note (Signed)
A1c

## 2019-04-06 NOTE — Assessment & Plan Note (Signed)
There has been some recent confusion Work-up in the ED showed no concerning causes We will recheck blood work today including CBC, CMP, TSH Discussed an MRI of the head-she would like to avoid this if it is not necessary Will refer to neurology for further evaluation and work-up

## 2019-04-07 ENCOUNTER — Telehealth: Payer: Self-pay | Admitting: Psychiatry

## 2019-04-07 ENCOUNTER — Other Ambulatory Visit: Payer: Self-pay | Admitting: Psychiatry

## 2019-04-07 MED ORDER — ROSUVASTATIN CALCIUM 40 MG PO TABS: 40.0000 mg | ORAL_TABLET | Freq: Every day | ORAL | 1 refills | Status: AC

## 2019-04-07 NOTE — Telephone Encounter (Signed)
Pt calling back to check status. Please advise  °

## 2019-04-07 NOTE — Telephone Encounter (Signed)
Pt would like a someone to give her a call today. Having some issues concerning her son.

## 2019-04-07 NOTE — Telephone Encounter (Signed)
Spoke with pt. And she states that she got it all straightened out and does not have any needs at this time.

## 2019-04-07 NOTE — Telephone Encounter (Signed)
Noted, thank you

## 2019-04-07 NOTE — Telephone Encounter (Signed)
Pt has called again and wannts info as soon as possible. FU

## 2019-04-08 NOTE — Telephone Encounter (Signed)
Spoke with pt in regards  

## 2019-04-09 ENCOUNTER — Telehealth: Payer: Self-pay

## 2019-04-09 NOTE — Telephone Encounter (Signed)
I can advise daughter that neuro referral has been ordered in regards to memory. Do you want to set up an appointment for patient and daughter to come in to discuss further? Or do you feel that necessary?

## 2019-04-09 NOTE — Telephone Encounter (Signed)
Call Jodie  - let her know I agree and needs further evaluation.  She was referred to neuro and it would be helpful for her to go to that appointment.
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
## 2019-04-13 NOTE — Telephone Encounter (Signed)
LVM for daughter to call back in regards.  

## 2019-04-14 ENCOUNTER — Telehealth: Payer: Self-pay

## 2019-04-14 ENCOUNTER — Ambulatory Visit: Payer: Medicare Other | Admitting: Psychiatry

## 2019-04-14 NOTE — Telephone Encounter (Signed)
Tried calling daughter. No answer. LVM for her to call back.

## 2019-04-14 NOTE — Telephone Encounter (Signed)
Copied from [place] [id number]. Topic: General - Inquiry >> Apr 14, 2019  9:46 AM Alease Frame wrote: Reason for CRM: Patient daughter called to speak with Lovena Le from office . Please advise   Call back [phone] Leveda Anna )

## 2019-04-15 ENCOUNTER — Telehealth: Payer: Self-pay

## 2019-04-15 NOTE — Telephone Encounter (Signed)
Tried calling daughter back. Please send directly to me when she calls back.

## 2019-04-15 NOTE — Telephone Encounter (Signed)
Copied from [place] [id number]. Topic: General - Call Back - No Documentation >> Apr 14, 2019  2:58 PM Erick Blinks wrote: Reason for CRM: Pt's daughter Jeral Fruit called requesting to speak with Lovena Le the [place], please advise  Best contact: [phone]

## 2019-04-24 ENCOUNTER — Other Ambulatory Visit: Payer: Self-pay | Admitting: Psychiatry

## 2019-04-25 NOTE — Telephone Encounter (Signed)
Last apt 11/11, due back 4 weeks. Nothing scheduled at this time.

## 2019-04-30 ENCOUNTER — Telehealth: Payer: Self-pay | Admitting: Internal Medicine

## 2019-04-30 NOTE — Telephone Encounter (Signed)
Patient's daughter informed of below and I provided her with [facility] phone #. She will  call and schedule patient.

## 2019-04-30 NOTE — Telephone Encounter (Signed)
Called daughter.  Let her know neurology has tried to call her mom to make an appointment, but has not been able to reach her.  Please give her daughter the number for neurology so that she can schedule an appointment.  She needs to be evaluated by a neurologist.

## 2019-04-30 NOTE — Telephone Encounter (Signed)
  Patients daughter calling with concerns/ questions about current medications patient is taking. Mrs Kathleen Lin states she feels her mothers memory is "slipping". Please call [phone]

## 2019-05-07 ENCOUNTER — Ambulatory Visit: Payer: Medicare Other | Admitting: Internal Medicine

## 2019-05-07 DIAGNOSIS — Z0289 Encounter for other administrative examinations: Secondary | ICD-10-CM

## 2019-05-12 ENCOUNTER — Telehealth: Payer: Self-pay | Admitting: *Deleted

## 2019-05-12 ENCOUNTER — Telehealth: Payer: Self-pay

## 2019-05-12 NOTE — Telephone Encounter (Signed)
Tried calling pt back to clarify msg below. Home # on file is not in service. Called cell no answer & not able to leave msg due to vm being full.Marland KitchenRaechel Chute

## 2019-05-12 NOTE — Telephone Encounter (Signed)
Copied from CRM [id number]. Topic: Quick Communication - Lab Results (Clinic Use ONLY) >> May 12, 2019  7:21 AM Louie Bun, Rosey Bath D wrote: Patient called and would like labs drawn at the office and send to [facility]. I told her office would need orders from them before doing this. Please call patient back, thanks.

## 2019-05-12 NOTE — Telephone Encounter (Signed)
Pt brought a letter from [facility] regarding proof of no more hx of seizures so pt can get her drivers license back. Pt req ret call to further explain.

## 2019-05-13 ENCOUNTER — Other Ambulatory Visit: Payer: Self-pay

## 2019-05-13 ENCOUNTER — Other Ambulatory Visit (INDEPENDENT_AMBULATORY_CARE_PROVIDER_SITE_OTHER): Payer: Medicare Other

## 2019-05-13 DIAGNOSIS — D6861 Antiphospholipid syndrome: Secondary | ICD-10-CM

## 2019-05-13 LAB — PROTIME-INR
INR: 2.2 ratio — ABNORMAL HIGH (ref 0.8–1.0)
Prothrombin Time: 24.9 s — ABNORMAL HIGH (ref 9.6–13.1)

## 2019-05-13 NOTE — Telephone Encounter (Signed)
She needs to see neuro. [facility] has tried contacting her several times.   Please give her their number to set up an appointment

## 2019-05-13 NOTE — Telephone Encounter (Signed)
Spoke with daughter and she stated she was going to get an appointment scheduled with neurology.

## 2019-05-13 NOTE — Telephone Encounter (Signed)
We can not do her PT/INR here routinely.  We do not do labs for outside physicians here for several reasons. If she wants to continue to have [facility] manage it they may be able to give her a standing order for a local lab.  The only other option is to have our warfarin nurse manage it.    See other phone note

## 2019-05-13 NOTE — Telephone Encounter (Signed)
We will do INR/PT here in our office and fax to [facility] because of the extenuating circumstances.

## 2019-05-13 NOTE — Telephone Encounter (Signed)
Number was given to patient while she was in the office today.

## 2019-05-13 NOTE — Telephone Encounter (Signed)
Pt came in today to have PT/INR done. She is asking if you would continue to draw this lab for her since she can not drive and [facility] is so far. Please advise
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
## 2019-05-13 NOTE — Telephone Encounter (Signed)
Just wanted you to see this.

## 2019-05-13 NOTE — Telephone Encounter (Signed)
Tried calling pt again still no answer and can't leave msg due to vm being full.Marland KitchenShearon Lin,

## 2019-05-14 NOTE — Telephone Encounter (Signed)
The first available with Dr Karel Jarvis (who they would prefer that she see) is not until March 18th.  Do you suggest that we just wait and see if [facility] has cancellations to see her sooner?

## 2019-05-14 NOTE — Telephone Encounter (Signed)
Patient called stating that she spoke to [facility] but their first available appointment is not until the end of February. They do have her on a cancellation list, but she wanted to know if there was anyone else that you could recommend?

## 2019-05-14 NOTE — Telephone Encounter (Signed)
Spoke to patient today and informed her that we will draw the lab here in our office at this time. She appreciated that very much.  ** She seemed to be in much better spirits today.

## 2019-05-14 NOTE — Telephone Encounter (Signed)
Yes - thank you

## 2019-05-14 NOTE — Telephone Encounter (Signed)
She can try [place] [facility].  If they are able to see her sooner I can put in a referral.

## 2019-05-14 NOTE — Telephone Encounter (Signed)
Patient is going to contact [facility] and will let me know if she is able to be seen sooner.

## 2019-05-15 NOTE — Telephone Encounter (Signed)
Results re-faxed.  

## 2019-05-15 NOTE — Telephone Encounter (Signed)
Nurse has called back in regard.  She has not received these results.  States this can be faxed to [phone].

## 2019-05-18 NOTE — Telephone Encounter (Signed)
   Please return call to patient to discuss appointment with Dr Karel Jarvis.

## 2019-05-29 ENCOUNTER — Telehealth: Payer: Self-pay | Admitting: Internal Medicine

## 2019-05-29 NOTE — Telephone Encounter (Signed)
Spoke with Sallye Ober and told her that we will do PT/INR. [facility] will fax over orders.

## 2019-05-29 NOTE — Telephone Encounter (Signed)
Requesting call back as soon as possible in regard to patient.

## 2019-06-01 ENCOUNTER — Other Ambulatory Visit: Payer: Self-pay | Admitting: Internal Medicine

## 2019-06-01 DIAGNOSIS — Z7901 Long term (current) use of anticoagulants: Secondary | ICD-10-CM

## 2019-06-01 DIAGNOSIS — D6861 Antiphospholipid syndrome: Secondary | ICD-10-CM

## 2019-06-05 ENCOUNTER — Telehealth: Payer: Self-pay | Admitting: Internal Medicine

## 2019-06-05 NOTE — Telephone Encounter (Signed)
Thank you! Maggie Font (at [facility]) wanted to give it time for her to have the results by the 18th.

## 2019-06-05 NOTE — Telephone Encounter (Signed)
I received a fax that said 2/18. Either way orders are put in for her to get done whenever she wants to come in.

## 2019-06-05 NOTE — Telephone Encounter (Signed)
Per Maggie Font at [facility]. The patient should have her INR checked again on Tuesday, 06/09/2019. Can orders be put in for please?  I will call and inform the patient.

## 2019-06-09 ENCOUNTER — Other Ambulatory Visit (INDEPENDENT_AMBULATORY_CARE_PROVIDER_SITE_OTHER): Payer: Medicare Other

## 2019-06-09 DIAGNOSIS — Z7901 Long term (current) use of anticoagulants: Secondary | ICD-10-CM

## 2019-06-09 DIAGNOSIS — D6861 Antiphospholipid syndrome: Secondary | ICD-10-CM | POA: Diagnosis not present

## 2019-06-09 LAB — PROTIME-INR
INR: 2 ratio — ABNORMAL HIGH (ref 0.8–1.0)
Prothrombin Time: 21.7 s — ABNORMAL HIGH (ref 9.6–13.1)

## 2019-06-12 ENCOUNTER — Other Ambulatory Visit: Payer: Self-pay

## 2019-06-12 ENCOUNTER — Encounter: Payer: Self-pay | Admitting: Psychiatry

## 2019-06-12 ENCOUNTER — Ambulatory Visit (INDEPENDENT_AMBULATORY_CARE_PROVIDER_SITE_OTHER): Payer: Medicare Other | Admitting: Psychiatry

## 2019-06-12 ENCOUNTER — Ambulatory Visit: Payer: Medicare Other | Admitting: Psychiatry

## 2019-06-12 DIAGNOSIS — F331 Major depressive disorder, recurrent, moderate: Secondary | ICD-10-CM

## 2019-06-12 DIAGNOSIS — G3184 Mild cognitive impairment, so stated: Secondary | ICD-10-CM

## 2019-06-12 DIAGNOSIS — F411 Generalized anxiety disorder: Secondary | ICD-10-CM

## 2019-06-12 DIAGNOSIS — F482 Pseudobulbar affect: Secondary | ICD-10-CM | POA: Diagnosis not present

## 2019-06-12 DIAGNOSIS — I6930 Unspecified sequelae of cerebral infarction: Secondary | ICD-10-CM

## 2019-06-12 MED ORDER — NUEDEXTA 20-10 MG PO CAPS: 1.0000 | ORAL_CAPSULE | Freq: Two times a day (BID) | ORAL | 1 refills | Status: AC

## 2019-06-12 MED ORDER — SERTRALINE HCL 50 MG PO TABS: 150.0000 mg | ORAL_TABLET | Freq: Every day | ORAL | 1 refills | Status: AC

## 2019-06-12 NOTE — Patient Instructions (Addendum)
Bring meds to appointments  Increase sertraline to 3 tablets daily   Continue Nudexta twice daily

## 2019-06-12 NOTE — Addendum Note (Signed)
Addended by: Kirstie Peri on: 06/12/2019 12:15 PM   Modules accepted: Orders
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
## 2019-06-22 ENCOUNTER — Other Ambulatory Visit (INDEPENDENT_AMBULATORY_CARE_PROVIDER_SITE_OTHER): Payer: Medicare Other

## 2019-06-22 DIAGNOSIS — Z7901 Long term (current) use of anticoagulants: Secondary | ICD-10-CM

## 2019-06-22 DIAGNOSIS — D6861 Antiphospholipid syndrome: Secondary | ICD-10-CM | POA: Diagnosis not present

## 2019-06-22 LAB — PROTIME-INR
INR: 2.6 ratio — ABNORMAL HIGH (ref 0.8–1.0)
Prothrombin Time: 29.3 s — ABNORMAL HIGH (ref 9.6–13.1)

## 2019-06-23 ENCOUNTER — Telehealth: Payer: Self-pay

## 2019-06-23 ENCOUNTER — Ambulatory Visit: Payer: Medicare Other | Admitting: Internal Medicine

## 2019-06-23 ENCOUNTER — Telehealth: Payer: Self-pay | Admitting: Psychiatry

## 2019-06-23 NOTE — Telephone Encounter (Signed)
[facility] calling to get patients lab results from 06/22/2019 visit. Kathleen Lin is aware that someone will be giving her a call back with the results today      Please call and advise

## 2019-06-23 NOTE — Telephone Encounter (Signed)
Spoke with Sallye Ober. Faxed over PT/INR results for patient.

## 2019-06-23 NOTE — Telephone Encounter (Signed)
Patient called and said that dr. Jennelle Human needs to call dr. Cheryll Cockayne. She wants to see all the medications she is on

## 2019-06-24 ENCOUNTER — Encounter: Payer: Self-pay | Admitting: Neurology

## 2019-06-24 ENCOUNTER — Emergency Department (HOSPITAL_COMMUNITY): Payer: Medicare Other

## 2019-06-24 ENCOUNTER — Other Ambulatory Visit: Payer: Self-pay

## 2019-06-24 ENCOUNTER — Telehealth: Payer: Self-pay | Admitting: Neurology

## 2019-06-24 ENCOUNTER — Encounter (HOSPITAL_COMMUNITY): Payer: Self-pay

## 2019-06-24 ENCOUNTER — Emergency Department (HOSPITAL_COMMUNITY)
Admission: EM | Admit: 2019-06-24 | Discharge: 2019-06-26 | Disposition: A | Discharge status: Other Acute Inpatient Hospital | Payer: Medicare Other | Attending: Emergency Medicine | Admitting: Emergency Medicine

## 2019-06-24 ENCOUNTER — Ambulatory Visit (INDEPENDENT_AMBULATORY_CARE_PROVIDER_SITE_OTHER): Payer: Medicare Other | Admitting: Neurology

## 2019-06-24 VITALS — BP 161/96 | HR 86 | Temp 96.9°F | Ht 66.0 in | Wt 176.0 lb

## 2019-06-24 DIAGNOSIS — F482 Pseudobulbar affect: Secondary | ICD-10-CM | POA: Diagnosis not present

## 2019-06-24 DIAGNOSIS — F909 Attention-deficit hyperactivity disorder, unspecified type: Secondary | ICD-10-CM | POA: Insufficient documentation

## 2019-06-24 DIAGNOSIS — Z9119 Patient's noncompliance with other medical treatment and regimen: Secondary | ICD-10-CM | POA: Diagnosis not present

## 2019-06-24 DIAGNOSIS — F01518 Vascular dementia, unspecified severity, with other behavioral disturbance: Secondary | ICD-10-CM | POA: Insufficient documentation

## 2019-06-24 DIAGNOSIS — F0150 Vascular dementia without behavioral disturbance: Secondary | ICD-10-CM | POA: Diagnosis not present

## 2019-06-24 DIAGNOSIS — F332 Major depressive disorder, recurrent severe without psychotic features: Secondary | ICD-10-CM | POA: Diagnosis not present

## 2019-06-24 DIAGNOSIS — F0151 Vascular dementia with behavioral disturbance: Secondary | ICD-10-CM | POA: Diagnosis not present

## 2019-06-24 DIAGNOSIS — Z79899 Other long term (current) drug therapy: Secondary | ICD-10-CM | POA: Diagnosis not present

## 2019-06-24 DIAGNOSIS — Z20822 Contact with and (suspected) exposure to covid-19: Secondary | ICD-10-CM | POA: Insufficient documentation

## 2019-06-24 DIAGNOSIS — Z9189 Other specified personal risk factors, not elsewhere classified: Secondary | ICD-10-CM

## 2019-06-24 DIAGNOSIS — Z7901 Long term (current) use of anticoagulants: Secondary | ICD-10-CM | POA: Diagnosis not present

## 2019-06-24 DIAGNOSIS — Z91199 Patient's noncompliance with other medical treatment and regimen due to unspecified reason: Secondary | ICD-10-CM

## 2019-06-24 DIAGNOSIS — F3010 Manic episode without psychotic symptoms, unspecified: Secondary | ICD-10-CM | POA: Insufficient documentation

## 2019-06-24 LAB — URINALYSIS, ROUTINE W REFLEX MICROSCOPIC
Bilirubin Urine: NEGATIVE
Glucose, UA: NEGATIVE mg/dL
Hgb urine dipstick: NEGATIVE
Ketones, ur: NEGATIVE mg/dL
Nitrite: NEGATIVE
Protein, ur: NEGATIVE mg/dL
Specific Gravity, Urine: 1.015 (ref 1.005–1.030)
pH: 7 (ref 5.0–8.0)

## 2019-06-24 LAB — SARS CORONAVIRUS 2 (TAT 6-24 HRS): SARS Coronavirus 2: NEGATIVE

## 2019-06-24 LAB — CBC WITH DIFFERENTIAL/PLATELET
Abs Immature Granulocytes: 0.01 10*3/uL (ref 0.00–0.07)
Basophils Absolute: 0 10*3/uL (ref 0.0–0.1)
Basophils Relative: 0 %
Eosinophils Absolute: 0 10*3/uL (ref 0.0–0.5)
Eosinophils Relative: 1 %
HCT: 47.9 % — ABNORMAL HIGH (ref 36.0–46.0)
Hemoglobin: 15.4 g/dL — ABNORMAL HIGH (ref 12.0–15.0)
Immature Granulocytes: 0 %
Lymphocytes Relative: 16 %
Lymphs Abs: 1 10*3/uL (ref 0.7–4.0)
MCH: 27.8 pg (ref 26.0–34.0)
MCHC: 32.2 g/dL (ref 30.0–36.0)
MCV: 86.6 fL (ref 80.0–100.0)
Monocytes Absolute: 0.5 10*3/uL (ref 0.1–1.0)
Monocytes Relative: 8 %
Neutro Abs: 4.7 10*3/uL (ref 1.7–7.7)
Neutrophils Relative %: 75 %
Platelets: 134 10*3/uL — ABNORMAL LOW (ref 150–400)
RBC: 5.53 MIL/uL — ABNORMAL HIGH (ref 3.87–5.11)
RDW: 13.4 % (ref 11.5–15.5)
WBC: 6.2 10*3/uL (ref 4.0–10.5)
nRBC: 0 % (ref 0.0–0.2)

## 2019-06-24 LAB — RAPID URINE DRUG SCREEN, HOSP PERFORMED
Amphetamines: NOT DETECTED
Barbiturates: NOT DETECTED
Benzodiazepines: NOT DETECTED
Cocaine: NOT DETECTED
Opiates: NOT DETECTED
Tetrahydrocannabinol: NOT DETECTED

## 2019-06-24 LAB — COMPREHENSIVE METABOLIC PANEL
ALT: 18 U/L (ref 0–44)
AST: 52 U/L — ABNORMAL HIGH (ref 15–41)
Albumin: 4 g/dL (ref 3.5–5.0)
Alkaline Phosphatase: 64 U/L (ref 38–126)
Anion gap: 11 (ref 5–15)
BUN: 21 mg/dL (ref 8–23)
CO2: 27 mmol/L (ref 22–32)
Calcium: 9.2 mg/dL (ref 8.9–10.3)
Chloride: 100 mmol/L (ref 98–111)
Creatinine, Ser: 1.22 mg/dL — ABNORMAL HIGH (ref 0.44–1.00)
GFR calc Af Amer: 52 mL/min — ABNORMAL LOW (ref >60)
GFR calc non Af Amer: 45 mL/min — ABNORMAL LOW (ref >60)
Glucose, Bld: 100 mg/dL — ABNORMAL HIGH (ref 70–99)
Potassium: 3.9 mmol/L (ref 3.5–5.1)
Sodium: 138 mmol/L (ref 135–145)
Total Bilirubin: 0.8 mg/dL (ref 0.3–1.2)
Total Protein: 7.1 g/dL (ref 6.5–8.1)

## 2019-06-24 LAB — PROTIME-INR
INR: 2.5 — ABNORMAL HIGH (ref 0.8–1.2)
Prothrombin Time: 27.2 seconds — ABNORMAL HIGH (ref 11.4–15.2)

## 2019-06-24 LAB — ETHANOL: Alcohol, Ethyl (B): <10 mg/dL (ref <10)

## 2019-06-24 MED ORDER — LORAZEPAM 0.5 MG PO TABS
0.5000 mg | ORAL_TABLET | Freq: Four times a day (QID) | ORAL | Status: DC | PRN
  Administered 2019-06-25 – 2019-06-26 (×2): 0.5 mg via ORAL
  Filled 2019-06-24 (×2): qty 1

## 2019-06-24 MED ORDER — WARFARIN - PHARMACIST DOSING INPATIENT: Freq: Every day | Status: DC

## 2019-06-24 MED ORDER — TRAZODONE HCL 50 MG PO TABS: 50.0000 mg | ORAL_TABLET | Freq: Every day | ORAL | Status: DC

## 2019-06-24 MED ORDER — ROSUVASTATIN CALCIUM 5 MG PO TABS: 40.0000 mg | ORAL_TABLET | Freq: Every day | ORAL | Status: DC

## 2019-06-24 MED ORDER — SERTRALINE HCL 50 MG PO TABS
150.0000 mg | ORAL_TABLET | Freq: Every day | ORAL | Status: DC
  Administered 2019-06-25 – 2019-06-26 (×2): 150 mg via ORAL
  Filled 2019-06-24 (×2): qty 1

## 2019-06-24 NOTE — ED Triage Notes (Signed)
Pt arrives accompanied by daughter from neurologist office for psych eval. Pt has been more manic lately. Pt sent here for psych admission. Pt a.o, ambulatory. Pt calm and cooperative at this time.

## 2019-06-24 NOTE — ED Notes (Signed)
Belongings placed in locker 2.  

## 2019-06-24 NOTE — Telephone Encounter (Signed)
[employer] medicare order sent to [facility]. No auth they will reach out to the patient to schedule.  
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
## 2019-06-24 NOTE — ED Provider Notes (Addendum)
[facility] EMERGENCY DEPARTMENT Provider Note   CSN: [id number] Arrival date & time: 06/24/19  1660     History Chief Complaint  Patient presents with  . Psychiatric Evaluation    Kathleen Lin is a 74 y.o. female.  Patient sent in by [facility] neurology.  Was seen in the office there today.  They were concerned about manic behavior.  Patient is followed by them for memory issues as well as followed by psychiatry.  Patient seen by psychiatrist in the month of February.  Other concerns are vascular dementia and bipolar disorder.  Patient has been treated for depression in the past.  [facility] neurology felt that patient was not capable of taking care of herself at home.  Patient does not mention any suicidal ideation.  Patient here is cooperative and alert.  Her daughter was with her.  Patient does live by herself.  [place] neurology was typically requested behavioral health consultation.        Past Medical History:  Diagnosis Date  . Antiphospholipid antibody syndrome (HCC)   . Hypercholesteremia   . Migraines   . Polycythemia   . Stroke [facility]) [id number]    Patient Active Problem List   Diagnosis Date Noted  . Vascular dementia with behavior disturbance (HCC) 06/24/2019  . Medical non-compliance 06/24/2019  . At risk for unsafe behavior 06/24/2019  . 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 Manic behavior (HCC) 06/24/2019  . Cognitive impairment 04/06/2019  . Confusion 04/06/2019  . Urinary frequency 04/07/2018  . Insomnia 11/19/2016  . Osteopenia 01/19/2016  . Renal insufficiency 12/20/2015  . Hyperglycemia 12/20/2015  . Anxiety 05/21/2015  . ADD (attention deficit disorder) 05/21/2015  . Overactive bladder 05/20/2015  . Long term current use of anticoagulant 07/02/2012  . Antiphospholipid syndrome (HCC) 11/07/2011  . Hypercholesterolemia 11/07/2011  . Headache, migraine 11/07/2011  . Depression 07/18/2007  . Cerebral artery occlusion with cerebral  infarction (HCC) 07/18/2007  . GERD 07/18/2007  . HEMORRHOIDS, HX OF 07/18/2007    Past Surgical History:  Procedure Laterality Date  . ABDOMINAL HYSTERECTOMY    . NASAL SEPTUM SURGERY       OB History   No obstetric history on file.     Family History  Problem Relation Age of Onset  . Hypertension Mother   . Dementia Mother   . Hearing loss Mother   . Heart disease Father   . Hyperlipidemia Father   . Miscarriages / Stillbirths Maternal Grandfather   . Non-Hodgkin's lymphoma Brother     Social History   Tobacco Use  . Smoking status: Never Smoker  . Smokeless tobacco: Never Used  Substance Use Topics  . Alcohol use: Never  . Drug use: No    Home Medications Prior to Admission medications   Medication Sig Start Date End Date Taking? Authorizing Provider  Azelastine-Fluticasone (DYMISTA) 137-50 MCG/ACT SUSP Place into the nose as needed. Patient taking differently: Place 1 spray into both nostrils as needed (congestion). Place into the nose as needed. 11/03/18  Yes Burns, Bobette Mo, MD  Cholecalciferol (VITAMIN D3) 2000 units capsule Take 1 capsule (2,000 Units total) by mouth daily. 05/21/16  Yes Burns, Bobette Mo, MD  LORazepam (ATIVAN) 0.5 MG tablet Take 0.5 mg by mouth as needed for anxiety.   Yes [provider]  RABEprazole (ACIPHEX) 20 MG tablet Take 1 tablet (20 mg total) by mouth daily as needed. Patient taking differently: Take 20 mg by mouth daily as needed (heartburn).  07/28/18  Yes Burns,  Claudina Lick, MD  rosuvastatin (CRESTOR) 40 MG tablet Take 1 tablet (40 mg total) by mouth daily. 04/07/19  Yes Burns, Claudina Lick, MD  sertraline (ZOLOFT) 50 MG tablet Take 3 tablets (150 mg total) by mouth daily. 06/12/19  Yes Cottle, Billey Co., MD  traZODone (DESYREL) 50 MG tablet 1-2 tablets as needed for sleep Patient taking differently: Take 50-100 mg by mouth at bedtime as needed for sleep. 1-2 tablets as needed for sleep 12/12/18  Yes Cottle, Billey Co., MD   triamcinolone ointment (KENALOG) 0.1 % Apply 1 application topically 2 (two) times daily as needed (rash). Apply twice daily 10/25/16  Yes [provider]  Vaginal Lubricant (REPLENS) GEL Place vaginally.   Yes [provider]  warfarin (JANTOVEN) 5 MG tablet Take 2.5-5 mg by mouth See admin instructions. Taking 2.5mg   daily  except for Monday and Friday taking 5mg  09/25/18 09/25/19 Yes [provider]  atomoxetine (STRATTERA) 100 MG capsule TAKE 1 CAPSULE BY MOUTH EVERY DAY Patient not taking: TAKE 1 CAPSULE BY MOUTH EVERY DAY 04/25/19   Cottle, Billey Co., MD  Dextromethorphan-quiNIDine (NUEDEXTA) 20-10 MG capsule Take 1 capsule by mouth 2 (two) times daily. Patient not taking: Reported on 06/24/2019 06/12/19   Purnell Shoemaker., MD  MYRBETRIQ 50 MG TB24 tablet Take 25 mg by mouth daily.  01/30/18   [provider]    Allergies    Patient has no known allergies.  Review of Systems   Review of Systems  Constitutional: Negative for chills and fever.  HENT: Negative for congestion, rhinorrhea and sore throat.   Eyes: Negative for visual disturbance.  Respiratory: Negative for cough and shortness of breath.   Cardiovascular: Negative for chest pain and leg swelling.  Gastrointestinal: Negative for abdominal pain, diarrhea, nausea and vomiting.  Genitourinary: Negative for dysuria.  Musculoskeletal: Negative for back pain and neck pain.  Skin: Negative for rash.  Neurological: Negative for dizziness, light-headedness and headaches.  Hematological: Does not bruise/bleed easily.  Psychiatric/Behavioral: Positive for behavioral problems, confusion and sleep disturbance. Negative for suicidal ideas.    Physical Exam Updated Vital Signs BP (!) 163/75   Pulse 66   Temp 98.2 F (36.8 C) (Oral)   Resp (!) 24   Ht 1.676 m (5\' 6" )   Wt 79.8 kg   SpO2 100%   BMI 28.41 kg/m   Physical Exam Vitals and nursing note reviewed.  Constitutional:      General: She  is not in acute distress.    Appearance: Normal appearance. She is well-developed.  HENT:     Head: Normocephalic and atraumatic.  Eyes:     Extraocular Movements: Extraocular movements intact.     Conjunctiva/sclera: Conjunctivae normal.     Pupils: Pupils are equal, round, and reactive to light.  Cardiovascular:     Rate and Rhythm: Normal rate and regular rhythm.     Heart sounds: No murmur.  Pulmonary:     Effort: Pulmonary effort is normal. No respiratory distress.     Breath sounds: Normal breath sounds.  Abdominal:     Palpations: Abdomen is soft.     Tenderness: There is no abdominal tenderness.  Musculoskeletal:        General: Normal range of motion.     Cervical back: Normal range of motion and neck supple.  Skin:    General: Skin is warm and dry.  Neurological:     Mental Status: She is alert.  Cranial Nerves: No cranial nerve deficit.     Sensory: No sensory deficit.     Motor: No weakness.     Comments: Patient alert.     ED Results / Procedures / Treatments   Labs (all labs ordered are listed, but only abnormal results are displayed) Labs Reviewed  URINALYSIS, ROUTINE W REFLEX MICROSCOPIC - Abnormal; Notable for the following components:      Result Value   Leukocytes,Ua SMALL (*)    Bacteria, UA MANY (*)    All other components within normal limits  COMPREHENSIVE METABOLIC PANEL - Abnormal; Notable for the following components:   Glucose, Bld 100 (*)    Creatinine, Ser 1.22 (*)    AST 52 (*)    GFR calc non Af Amer 45 (*)    GFR calc Af Amer 52 (*)    All other components within normal limits  CBC WITH DIFFERENTIAL/PLATELET - Abnormal; Notable for the following components:   RBC 5.53 (*)    Hemoglobin 15.4 (*)    HCT 47.9 (*)    Platelets 134 (*)    All other components within normal limits  PROTIME-INR - Abnormal; Notable for the following components:   Prothrombin Time 27.2 (*)    INR 2.5 (*)    All other components within normal limits   ETHANOL  RAPID URINE DRUG SCREEN, HOSP PERFORMED    EKG EKG Interpretation  Date/Time:  Wednesday June 24 2019 09:30:45 EST Ventricular Rate:  74 PR Interval:    QRS Duration: 82 QT Interval:  377 QTC Calculation: 419 R Axis:   -15 Text Interpretation: Sinus rhythm Probable left atrial enlargement Borderline left axis deviation Confirmed by Vanetta Mulders [phone]) on 06/24/2019 9:41:07 AM   Radiology DG Chest Port 1 View  Result Date: 06/24/2019 CLINICAL DATA:  Altered mental status. EXAM: PORTABLE CHEST 1 VIEW COMPARISON:  CT chest 04/10/2006. FINDINGS: Trachea is midline. Heart is at the upper limits of normal in size. Lungs are clear. No pleural fluid. IMPRESSION: No acute findings. Electronically Signed   By: Leanna Battles M.D.   On: 06/24/2019 09:58    Procedures Procedures (including critical care time)  Medications Ordered in ED Medications - No data to display  ED Course  I have reviewed the triage vital signs and the nursing notes.  Pertinent labs & imaging results that were available during my care of the patient were reviewed by me and considered in my medical decision making (see chart for details).    MDM Rules/Calculators/A&P                      Urinalysis still pending.  But otherwise medically cleared.  Urinalysis back and is normal.  Patient cleared from medical standpoint.  Will have behavioral health evaluate for any considerations may be for inpatient care due to manic behavior.  It is possible the patient may require placement through social services.   Final Clinical Impression(s) / ED Diagnoses Final diagnoses:  Manic behavior (HCC)  Vascular dementia without behavioral disturbance [facility])    Rx / DC Orders ED Discharge Orders    None       Vanetta Mulders, MD 06/24/19 1123    Vanetta Mulders, MD 06/24/19 1125   Patient interviewed by behavioral health.  They recommended geriatric psychiatric admission.  They will look for  placement.  They also informed me that she probably would be resistant to this and IVC her that she had to be admitted.  This correlates with a concern that she was not safe at home.  Patient IVC paperwork completed.    Vanetta Mulders, MD 06/24/19 1233

## 2019-06-24 NOTE — ED Notes (Addendum)
Pt says the TTS doctor said she needs to stay the night but she cannot do that. Pt keeps repeating that she is worried about her dog and has someone coming to fix her phone in the morning. Pt agitated, arguing, raising her voice

## 2019-06-24 NOTE — Progress Notes (Signed)
ANTICOAGULATION CONSULT NOTE - Initial Consult  Pharmacy Consult for warfarin Indication: stroke and antiphospholipid syndrome  No Known Allergies  Patient Measurements: Height: 5\' 6"  (167.6 cm) Weight: 176 lb (79.8 kg) IBW/kg (Calculated) : 59.3  Vital Signs: Temp: 97.8 F (36.6 C) (03/03 1818) Temp Source: Oral (03/03 1818) BP: 129/69 (03/03 1818) Pulse Rate: 74 (03/03 1818)  Labs: Recent Labs    06/22/19 0957 06/24/19 0930  HGB  --  15.4*  HCT  --  47.9*  PLT  --  134*  LABPROT 29.3* 27.2*  INR 2.6* 2.5*  CREATININE  --  1.22*    Estimated Creatinine Clearance: 45.1 mL/min (A) (by C-G formula based on SCr of 1.22 mg/dL (H)).   Medical History: Past Medical History:  Diagnosis Date  . Antiphospholipid antibody syndrome (HCC)   . Hypercholesteremia   . Migraines   . Polycythemia   . [facility]    Assessment: 26 yof presented to the ED with manic behavior from neurologist office. She is on chronic warfarin which will be continued while awaiting bed placement. INR is therapeutic at 2.5. She already took her dose for today.   PTA regimen: 5mg  MWF, 2.5mg  TTSS  Goal of Therapy:  INR 2.5-3.5 Monitor platelets by anticoagulation protocol: Yes   Plan:  No further warfarin today Daily INR  Kerrington Greenhalgh, 64 06/24/2019,7:56 PM

## 2019-06-24 NOTE — Progress Notes (Signed)
Pt meets inpatient criteria. Referral information has been sent to the following hospitals for review:  [facility] Details [facility] Details  [facility] Details [facility] Details  [facility] Details [facility] Details  [facility]  Disposition will continue to follow.   Wells Guiles, LCSW, LCAS Disposition CSW [facility] [phone]

## 2019-06-24 NOTE — Patient Instructions (Signed)
Emergency room  This is a patient who comes in with vascular dementia and significant psychiatric history, pseudobulbar affect, patient lives alone, she is a danger in her home, she has not been taking her medications, she is manic, disorganized,, emotionally labile, tangential, irritable, hostile and violent.  She needs to be admitted into a psychiatric facility.Sending her to [facility] ED. She cannot go home, she I snot safe in her home at this time. I highly recommend the ED admit her for psychiatric medical management and social work. Daughter is here but she cannot care for her and is not a POA, dughter does not live with patient.   Today's history and physical demonstrated very substantial and measurable cognitive losses consistent with dementia and psychiatric illness. Based on the prior experiences in the community in the substantial degree of impairment is clear that she does not have the capacity to make an informed and appropriate decisions on her healthcare and finances and she is not currently safe in her home. I do recommend that she lives in a structured setting. This also clear that patient does not comprehend the degree of cognitive losses were the risks that she has been in with her  own self-neglect within the home. She is not safe to go home. Will send to ED.

## 2019-06-24 NOTE — ED Notes (Addendum)
Pt daughter brought more belongings to patient. Additional belongings inventoried and placed in locker #2 along with original bag. Pt wearing bathrobe brought by daughter.

## 2019-06-24 NOTE — ED Notes (Signed)
Pt agitated, stating "I will not go see that psychiatrist. I'll go to [facility] to get someone good."

## 2019-06-24 NOTE — Telephone Encounter (Signed)
Sent note to PCP Dr. Lawerance Bach

## 2019-06-24 NOTE — ED Notes (Signed)
Daughter Lennox Laity called, pt given phone to talk to her.

## 2019-06-24 NOTE — ED Notes (Addendum)
When rounding on patient she keeps asking when can I go home.daughter is sitting at bedside.patient states she locked her keys and pocket book at home she needs .and then she states she has left a coffee pot on

## 2019-06-24 NOTE — Telephone Encounter (Signed)
This is a patient who comes in with vascular dementia and significant psychiatric history, pseudobulbar affect, patient lives alone, she is a danger in her home, she has not been taking her medications, she is manic, disorganized,, emotionally labile, tangential, irritable, hostile and violent.  She needs to be admitted into a psychiatric facility.Sending her to [facility] ED. She cannot go home, she I snot safe in her home at this time. I highly recommend the ED admit her for psychiatric medical management and social work. Daughter is here but she cannot care for her and is not a POA, dughter does not live with patient.   Today's history and physical demonstrated very substantial and measurable cognitive losses consistent with dementia and psychiatric illness. Based on the prior experiences in the community in the substantial degree of impairment is clear that she does not have the capacity to make an informed and appropriate decisions on her healthcare and finances and she is not currently safe in her home. I do recommend that she lives in a structured setting. This also clear that patient does not comprehend the degree of cognitive losses were the risks that she has been in with her  own self-neglect within the home. She is not safe to go home. Will send to ED.
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
## 2019-06-24 NOTE — ED Notes (Signed)
Pt changed into purple scrubs. Valuables (phone, ring, earrings) given to daughter.

## 2019-06-24 NOTE — ED Notes (Signed)
Meal tray ordered 

## 2019-06-24 NOTE — ED Notes (Signed)
Pt becoming increasingly agitated, saying she wants to go home. She has a dog and she left the coffee pot on. Pt told by daughter she has to stay and she will take care of those things for her. Pt repeating herself, saying she needs to go home.

## 2019-06-24 NOTE — ED Notes (Signed)
Patient denies pain and is resting comfortably.  

## 2019-06-25 ENCOUNTER — Telehealth: Payer: Self-pay | Admitting: Psychiatry

## 2019-06-25 ENCOUNTER — Telehealth: Payer: Self-pay | Admitting: Internal Medicine

## 2019-06-25 LAB — PROTIME-INR
INR: 2.6 — ABNORMAL HIGH (ref 0.8–1.2)
Prothrombin Time: 27.8 seconds — ABNORMAL HIGH (ref 11.4–15.2)

## 2019-06-25 MED ORDER — ZIPRASIDONE MESYLATE 20 MG IM SOLR
INTRAMUSCULAR | Status: AC
  Filled 2019-06-25: qty 20

## 2019-06-25 MED ORDER — ZIPRASIDONE MESYLATE 20 MG IM SOLR
10.0000 mg | Freq: Once | INTRAMUSCULAR | Status: AC
  Administered 2019-06-25: 10 mg via INTRAMUSCULAR

## 2019-06-25 MED ORDER — WARFARIN SODIUM 2.5 MG PO TABS
2.5000 mg | ORAL_TABLET | Freq: Once | ORAL | Status: AC
  Administered 2019-06-25: 19:00:00 2.5 mg via ORAL
  Filled 2019-06-25: qty 1

## 2019-06-25 MED ORDER — STERILE WATER FOR INJECTION IJ SOLN
INTRAMUSCULAR | Status: AC
  Filled 2019-06-25: qty 10

## 2019-06-25 MED ORDER — POLYETHYLENE GLYCOL 3350 17 G PO PACK
17.0000 g | PACK | Freq: Every day | ORAL | Status: DC | PRN
  Administered 2019-06-25: 17 g via ORAL
  Filled 2019-06-25: qty 1

## 2019-06-25 MED ORDER — ZIPRASIDONE MESYLATE 20 MG IM SOLR: 10.0000 mg | Freq: Once | INTRAMUSCULAR | Status: DC

## 2019-06-25 MED ORDER — LORAZEPAM 2 MG/ML IJ SOLN
1.0000 mg | Freq: Once | INTRAMUSCULAR | Status: DC
  Filled 2019-06-25: qty 1

## 2019-06-25 MED ORDER — LORAZEPAM 2 MG/ML IJ SOLN
1.0000 mg | Freq: Once | INTRAMUSCULAR | Status: AC
  Administered 2019-06-25: 13:00:00 1 mg via INTRAMUSCULAR
  Filled 2019-06-25: qty 1

## 2019-06-25 NOTE — ED Notes (Signed)
Pt speaking on phone with daughter.

## 2019-06-25 NOTE — Telephone Encounter (Signed)
Cobi's daughter ,Augusto Gamble called and said that rebie is in the hospital.. She would like to talk to you about her status. Please give her a call at [phone]

## 2019-06-25 NOTE — Progress Notes (Signed)
ANTICOAGULATION CONSULT NOTE  Pharmacy Consult for warfarin Indication: stroke and antiphospholipid syndrome  No Known Allergies  Patient Measurements: Height: 5\' 6"  (167.6 cm) Weight: 176 lb (79.8 kg) IBW/kg (Calculated) : 59.3  Vital Signs: Temp: 98.4 F (36.9 C) (03/04 0801) Temp Source: Oral (03/04 0801) BP: 112/69 (03/04 0801) Pulse Rate: 77 (03/04 0801)  Labs: Recent Labs    06/22/19 0957 06/24/19 0930 06/25/19 0442  HGB  --  15.4*  --   HCT  --  47.9*  --   PLT  --  134*  --   LABPROT 29.3* 27.2* 27.8*  INR 2.6* 2.5* 2.6*  CREATININE  --  1.22*  --     Estimated Creatinine Clearance: 45.1 mL/min (A) (by C-G formula based on SCr of 1.22 mg/dL (H)).   Medical History: Past Medical History:  Diagnosis Date  . Antiphospholipid antibody syndrome (HCC)   . Hypercholesteremia   . Migraines   . Polycythemia   . [facility]    Assessment: 58 yof on warfarin PTA for CVA history and antiphospholipid syndrome presenting with manic behavior from neurologist office. She is on chronic warfarin which will be continued while awaiting bed placement. INR remains therapeutic at 2.6. Hg 15.4, plt 134. No active bleed issues reported.  PTA regimen: 5mg  MWF, 2.5mg  TTSS (last dose 3/3 PTA)  Goal of Therapy:  INR 2.5-3.5 Monitor platelets by anticoagulation protocol: Yes   Plan:  Warfarin 2.5mg  PO x 1 - continue home dose Monitor daily INR, CBC, s/sx bleeding   62, PharmD, BCPS Please check AMION for all [facility] Pharmacy contact numbers Clinical Pharmacist 06/25/2019 8:53 AM

## 2019-06-25 NOTE — ED Notes (Signed)
IVC/Inpt placement  Breakfast ordered

## 2019-06-25 NOTE — ED Notes (Signed)
Pt out to desk. "I want to know why I am here" "I want to go home" I don't need to be here" "I want to speak to my doctor" "I need to go home to see my dog" "Where is my cell phone" "I need my phone" "why can't I leave?" "I feel like this place is crazy and I don't need to be here" Explained to pt that she is being taken care of and that we are here to help her. Pt demanding to leave. Pt made aware that she is unable to leave. Pt going to call daughter, and this RN stopped her. Told pt that it is 0500 in the morning and that she should wait a few hours before calling and waking her up. "you all are lying to me anyway-you all and her-everyone is lying". Reassured pt that we are here to help her.

## 2019-06-25 NOTE — ED Notes (Signed)
Pt very anxious, crying, wants to talk to Dr. , states she doesn't want to be here, etc; keeps walking in hall, has to be constantly re-directed; Dr. Particia Nearing notified

## 2019-06-25 NOTE — ED Notes (Signed)
Pt up walking back and forth in hall, tearful; pt directed back to room, pt cooperative
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
## 2019-06-25 NOTE — Telephone Encounter (Signed)
LVM to call back in regards. 

## 2019-06-25 NOTE — ED Notes (Signed)
Lunch tray at bedside. ?

## 2019-06-25 NOTE — ED Notes (Signed)
Pt cooperative, let this RN administer Geodon without security/[employer] intervention

## 2019-06-25 NOTE — ED Notes (Signed)
Lunch Tray Ordered @ 1054. 

## 2019-06-25 NOTE — ED Notes (Signed)
Breakfast at bedside.

## 2019-06-25 NOTE — Telephone Encounter (Signed)
New message:    Pt's daughter is calling and would like to speak with someone pertaining to the patient. She states the patient is currently in the hospital.

## 2019-06-25 NOTE — ED Notes (Signed)
Security at bedside; pt has removed R handle of hospital bed; pt asked by [employer] to turn over bed handle; pt cooperative

## 2019-06-25 NOTE — Telephone Encounter (Signed)
Spoke with daughter. She wanted to make sure we were aware that pt was admitted into the hospital. I advised we were aware.

## 2019-06-25 NOTE — Progress Notes (Addendum)
Pt continues to meets inpatient criteria. Referral information has been sent to the following hospitals for review:  [facility] Details  [facility] Details  [facility] Details  [facility] Details [facility] Details  [facility] Details  [facility] Details  [facility] Details  [facility] Details [facility] Details   Disposition will continue to assist with inpatient psych placement.   Wells Guiles, LCSW, LCAS Disposition CSW [facility]/TTS [phone]

## 2019-06-26 LAB — PROTIME-INR
INR: 2.2 — ABNORMAL HIGH (ref 0.8–1.2)
Prothrombin Time: 23.9 seconds — ABNORMAL HIGH (ref 11.4–15.2)

## 2019-06-26 MED ORDER — WARFARIN SODIUM 5 MG PO TABS
5.0000 mg | ORAL_TABLET | Freq: Once | ORAL | Status: DC
  Filled 2019-06-26: qty 1

## 2019-06-26 NOTE — ED Notes (Addendum)
Confirmation received IVC papers successfully faxed to [facility]. Awaiting bed assignment information to call nursing report.

## 2019-06-26 NOTE — ED Notes (Signed)
Lunch tray ordered 

## 2019-06-26 NOTE — ED Notes (Signed)
Sheriff on unit to transport pt to [place] per MD order. Personal property given to sheriff for transport. Ambulatory off unit in law enforcement custody.

## 2019-06-26 NOTE — ED Provider Notes (Signed)
Patient has been accepted by [facility].  She is awake, alert, ambulatory.   Gerhard Munch, MD 06/26/19 1340

## 2019-06-26 NOTE — ED Notes (Signed)
Sheriff called for transport  

## 2019-06-26 NOTE — ED Notes (Signed)
Pt talking on phone to daughter

## 2019-06-26 NOTE — Progress Notes (Deleted)
ANTICOAGULATION CONSULT NOTE  Pharmacy Consult for warfarin Indication: stroke and antiphospholipid syndrome  No Known Allergies  Patient Measurements: Height: 5\' 6"  (167.6 cm) Weight: 176 lb (79.8 kg) IBW/kg (Calculated) : 59.3  Vital Signs: Temp: 98.4 F (36.9 C) (03/05 0645) Temp Source: Oral (03/05 0645) BP: 154/69 (03/05 0645) Pulse Rate: 80 (03/05 0645)  Labs: Recent Labs    06/24/19 0930 06/25/19 0442 06/26/19 0357  HGB 15.4*  --   --   HCT 47.9*  --   --   PLT 134*  --   --   LABPROT 27.2* 27.8* 23.9*  INR 2.5* 2.6* 2.2*  CREATININE 1.22*  --   --     Estimated Creatinine Clearance: 45.1 mL/min (A) (by C-G formula based on SCr of 1.22 mg/dL (H)).   Medical History: Past Medical History:  Diagnosis Date  . Antiphospholipid antibody syndrome (HCC)   . Hypercholesteremia   . Migraines   . Polycythemia   . [facility]roke [facility]    Assessment: 54 yof on warfarin PTA for CVA history and antiphospholipid syndrome presenting with manic behavior from neurologist office. She is on chronic warfarin which will be continued while awaiting bed placement.  INR remains therapeutic today, no bleeding reported  PTA regimen: 5mg  MWF, 2.5mg  TTSS (last dose 3/3 PTA)  Goal of Therapy:  INR 2.5-3.5 Monitor platelets by anticoagulation protocol: Yes   Plan:  Give warfarin 5mg  PO x 1 today Daily INR, s/s bleeding  62, PharmD Clinical Pharmacist Please check AMION for all [facility] Pharmacy numbers 06/26/2019 7:42 AM

## 2019-06-26 NOTE — Progress Notes (Signed)
Pt accepted to [facility].  Dr. Orene Desanctis is the attending/accepting provider  Call report to [phone]  Ashley at [facility] ED notified  Pt is under IVC and will be transported by law enforcement  Pt may arrive as soon as transportation is arranged.   Admissions staff asks that if pt can not be transported by 5:30pm,  then to please arrange transportation after 8am on 06/27/19   Artist Pais, LCAS Disposition CSW [facility] B[facility]/TTS [phone]

## 2019-06-26 NOTE — ED Notes (Signed)
IVC/aggressive Inpt placement  Breakfast ordered

## 2019-06-26 NOTE — Progress Notes (Signed)
ANTICOAGULATION CONSULT NOTE  Pharmacy Consult for warfarin Indication: stroke and antiphospholipid syndrome  No Known Allergies  Patient Measurements: Height: 5\' 6"  (167.6 cm) Weight: 176 lb (79.8 kg) IBW/kg (Calculated) : 59.3  Vital Signs: Temp: 98.4 F (36.9 C) (03/05 0645) Temp Source: Oral (03/05 0645) BP: 154/69 (03/05 0645) Pulse Rate: 80 (03/05 0645)  Labs: Recent Labs    06/24/19 0930 06/25/19 0442 06/26/19 0357  HGB 15.4*  --   --   HCT 47.9*  --   --   PLT 134*  --   --   LABPROT 27.2* 27.8* 23.9*  INR 2.5* 2.6* 2.2*  CREATININE 1.22*  --   --     Estimated Creatinine Clearance: 45.1 mL/min (A) (by C-G formula based on SCr of 1.22 mg/dL (H)).   Medical History: Past Medical History:  Diagnosis Date  . Antiphospholipid antibody syndrome (HCC)   . Hypercholesteremia   . Migraines   . Polycythemia   . Stroke [facility]    Assessment: 70 yof on warfarin PTA for CVA history and antiphospholipid syndrome presenting with manic behavior from neurologist office. She is on chronic warfarin which will be continued while awaiting bed placement.  INR slightly subtherapeutic today with higher goals, no bleeding reported  PTA regimen: 5mg  MWF, 2.5mg  TTSS (last dose 3/3 PTA)  Goal of Therapy:  INR 2.5-3.5 Monitor platelets by anticoagulation protocol: Yes   Plan:  Give warfarin 5mg  PO x 1 today Daily INR, s/s bleeding  62, PharmD Clinical Pharmacist Please check AMION for all [facility] Pharmacy numbers 06/26/2019 7:45 AM

## 2019-06-26 NOTE — ED Notes (Signed)
Informed by [facility] that pt was accepted to [facility] pending  ED to fax IVC papers to [phone]. ED to fax papers.

## 2019-06-28 MED ORDER — GENERIC EXTERNAL MEDICATION
Status: DC
Start: ? — End: 2019-06-28

## 2019-06-28 MED ORDER — SERTRALINE HCL 100 MG PO TABS
100.00 | ORAL_TABLET | ORAL | Status: DC
Start: 2019-06-29 — End: 2019-06-28

## 2019-06-28 MED ORDER — TRAZODONE HCL 50 MG PO TABS
50.00 | ORAL_TABLET | ORAL | Status: DC
Start: ? — End: 2019-06-28

## 2019-06-28 MED ORDER — ATORVASTATIN CALCIUM 40 MG PO TABS
40.00 | ORAL_TABLET | ORAL | Status: DC
Start: 2019-07-02 — End: 2019-06-28

## 2019-06-28 MED ORDER — MIRABEGRON ER 25 MG PO TB24
25.00 | ORAL_TABLET | ORAL | Status: DC
Start: 2019-07-03 — End: 2019-06-28

## 2019-06-28 MED ORDER — WARFARIN SODIUM 2.5 MG PO TABS
2.50 | ORAL_TABLET | ORAL | Status: DC
Start: 2019-07-02 — End: 2019-06-28

## 2019-06-28 MED ORDER — CHOLECALCIFEROL 25 MCG (1000 UT) PO TABS
1000.00 | ORAL_TABLET | ORAL | Status: DC
Start: 2019-07-03 — End: 2019-06-28

## 2019-06-28 MED ORDER — LORAZEPAM 0.5 MG PO TABS
0.50 | ORAL_TABLET | ORAL | Status: DC
Start: ? — End: 2019-06-28

## 2019-06-28 MED ORDER — ACETAMINOPHEN 325 MG PO TABS
650.00 | ORAL_TABLET | ORAL | Status: DC
Start: ? — End: 2019-06-28

## 2019-06-28 MED ORDER — WARFARIN SODIUM 5 MG PO TABS
5.00 | ORAL_TABLET | ORAL | Status: DC
Start: 2019-07-01 — End: 2019-06-28

## 2019-06-29 LAB — FLUORESCENT TREPONEMAL AB(FTA)-IGG-BLD: Fluorescent Treponemal Ab, IgG: NONREACTIVE

## 2019-06-30 ENCOUNTER — Telehealth: Payer: Self-pay | Admitting: Psychiatry

## 2019-06-30 MED ORDER — GENERIC EXTERNAL MEDICATION
Status: DC
Start: ? — End: 2019-06-30

## 2019-06-30 MED ORDER — DEXTROMETHORPHAN-QUINIDINE 20-10 MG PO CAPS
1.00 | ORAL_CAPSULE | ORAL | Status: DC
Start: 2019-07-02 — End: 2019-06-30

## 2019-06-30 MED ORDER — ATOMOXETINE HCL 25 MG PO CAPS
100.00 | ORAL_CAPSULE | ORAL | Status: DC
Start: 2019-07-03 — End: 2019-06-30

## 2019-06-30 MED ORDER — GENERIC EXTERNAL MEDICATION
150.00 | Status: DC
Start: 2019-07-03 — End: 2019-06-30

## 2019-06-30 NOTE — Telephone Encounter (Signed)
Kathleen Lin patients daughter would like a call back. Mom Kathleen Lin is in the hospital and she needs to share some information concerning this admission.

## 2019-06-30 NOTE — Telephone Encounter (Signed)
Daughter made aware 

## 2019-06-30 NOTE — Telephone Encounter (Signed)
I have spoken with inpatient psychiatrist about her care.  We discussed treatment options.

## 2019-06-30 NOTE — Telephone Encounter (Signed)
Please let patient's daughter know that I have spoken with inpatient psychiatrist about Kathleen Lin.  We discussed treatment options medication options.  I also expressed my concerns that unless she had substantial improvement that I did not believe she would be able to continue to care for herself independently.  They are going to make some medicine adjustments in hopes that she will get substantially better.

## 2019-06-30 NOTE — Telephone Encounter (Signed)
Spoke with Kathleen Lin. She stated her mother is still in the Hospital at [facility]. She wants to know if anyone from there has reached out to Dr. Jennelle Human? She would like for you to contact them there and go over the care plan. She is concerned they might not understand her mother and would feel more comfortable knowing that you called them. Please advise.

## 2019-06-30 NOTE — Telephone Encounter (Signed)
I responded to a separate note about this.  Tereso Newcomer will contact the patient's daughter with additional information.

## 2019-07-01 LAB — AMMONIA: Ammonia: 60 ug/dL (ref 31–169)

## 2019-07-01 LAB — VITAMIN B1: Thiamine: 146.2 nmol/L (ref 66.5–200.0)

## 2019-07-01 LAB — B12 AND FOLATE PANEL
Folate: 11.5 ng/mL (ref >3.0)
Vitamin B-12: 334 pg/mL (ref 232–1245)

## 2019-07-01 LAB — HIV ANTIBODY (ROUTINE TESTING W REFLEX): HIV Screen 4th Generation wRfx: NONREACTIVE

## 2019-07-01 LAB — RPR, QUANT+TP ABS (REFLEX)
Rapid Plasma Reagin, Quant: 1:4 {titer} — ABNORMAL HIGH
T Pallidum Abs: UNDETERMINED

## 2019-07-01 LAB — METHYLMALONIC ACID, SERUM: Methylmalonic Acid: 294 nmol/L (ref 0–378)

## 2019-07-01 LAB — RPR: RPR Ser Ql: REACTIVE — AB

## 2019-07-01 LAB — HOMOCYSTEINE: Homocysteine: 20.3 umol/L — ABNORMAL HIGH (ref 0.0–19.2)

## 2019-07-02 ENCOUNTER — Ambulatory Visit: Payer: Medicare Other | Admitting: Addiction (Substance Use Disorder)

## 2019-07-02 MED ORDER — ACIDOPHILUS/PECTIN PO CAPS
1.00 | ORAL_CAPSULE | ORAL | Status: DC
Start: 2019-07-02 — End: 2019-07-02

## 2019-07-02 MED ORDER — GENERIC EXTERNAL MEDICATION
Status: DC
Start: ? — End: 2019-07-02

## 2019-07-02 MED ORDER — CEFDINIR 300 MG PO CAPS
300.00 | ORAL_CAPSULE | ORAL | Status: DC
Start: 2019-07-02 — End: 2019-07-02

## 2019-07-02 MED ORDER — WARFARIN SODIUM 5 MG PO TABS
5.00 | ORAL_TABLET | ORAL | Status: DC
Start: 2019-07-03 — End: 2019-07-02

## 2019-07-02 MED ORDER — COVID-19 MRNA VACCINE (PFIZER) 30 MCG/0.3ML IM SUSP
0.30 | INTRAMUSCULAR | Status: DC
Start: 2019-07-21 — End: 2019-07-02

## 2019-07-07 ENCOUNTER — Ambulatory Visit: Payer: Medicare Other | Admitting: Psychiatry

## 2019-08-10 ENCOUNTER — Ambulatory Visit: Payer: Medicare Other | Admitting: Psychiatry

## 2019-08-12 ENCOUNTER — Telehealth: Payer: Self-pay | Admitting: Psychiatry

## 2019-08-12 NOTE — Telephone Encounter (Signed)
Pt daughter Arville Go called to advise Yaniyah is now in [facility] in [place]. Lennox Laity is in the process of getting release of info and POA. Lennox Laity ask Dr.Cottle to return call to advise future care with him. [phone]

## 2019-08-12 NOTE — Telephone Encounter (Signed)
RTC LM .

## 2020-01-07 IMAGING — CT CT HEAD W/O CM
4 series · 17 of 47 positions shown, 19 images · non-contrast
Comparison: None.

CLINICAL DATA: Altered mental status

EXAM:
CT HEAD WITHOUT CONTRAST
TECHNIQUE: Contiguous axial images were obtained from the base of the skull
through the vertex without intravenous contrast.

[Series 3: head without · axial · non-contrast · 0.45mm/px · z∈[-176,-51]mm · 7 of 35 slices shown, 9 images]
[im 5/35  brain]
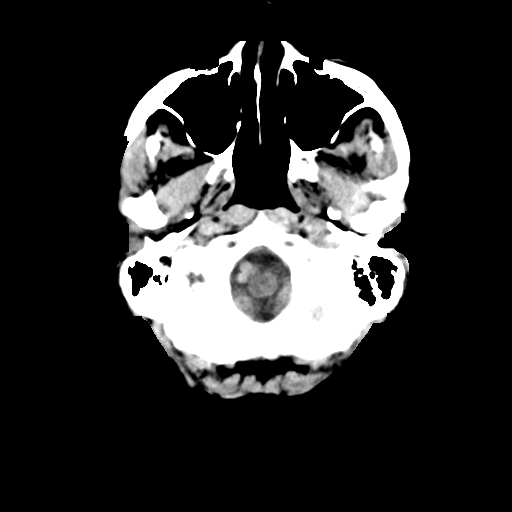
[im 5/35  bone]
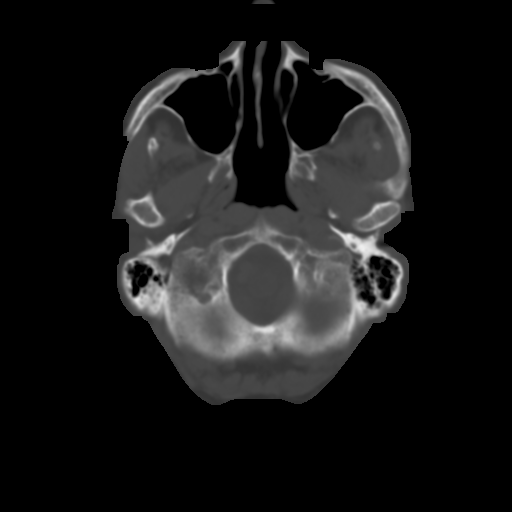
[im 9/35  brain]
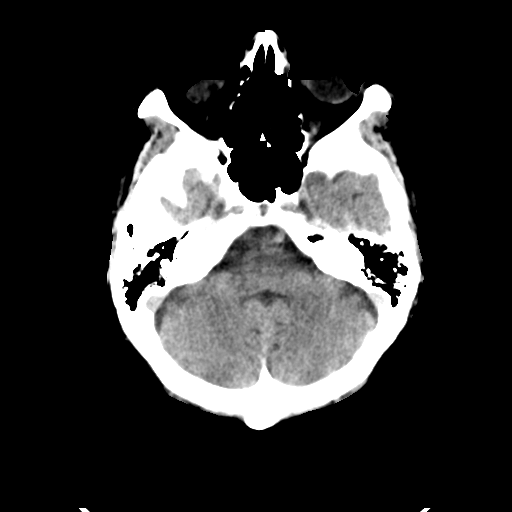
[im 13/35  brain]
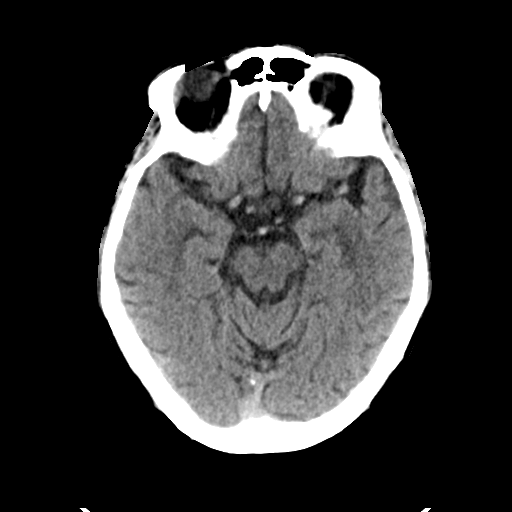
[im 18/35  brain]
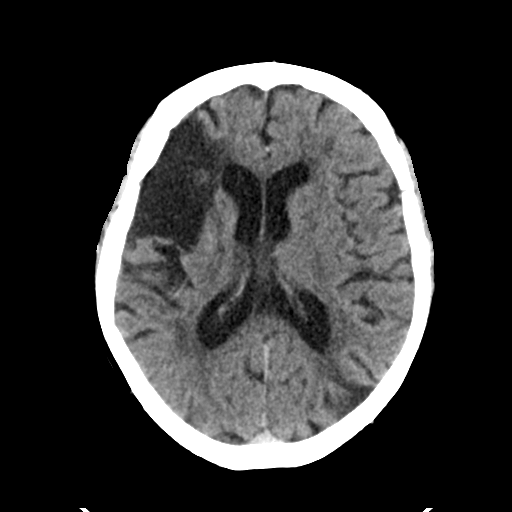
[im 22/35  brain]
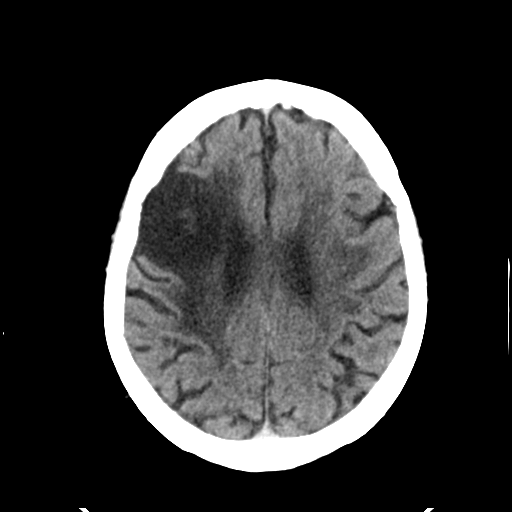
[im 22/35  bone]
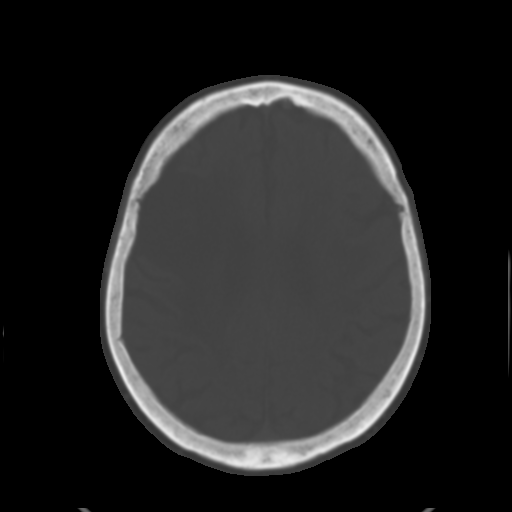
[im 26/35  brain]
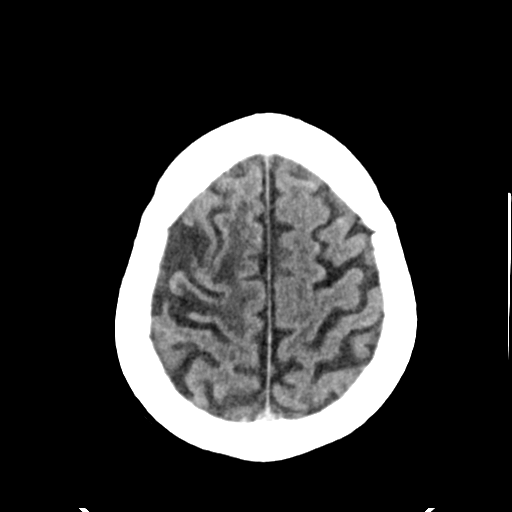
[im 30/35  brain]
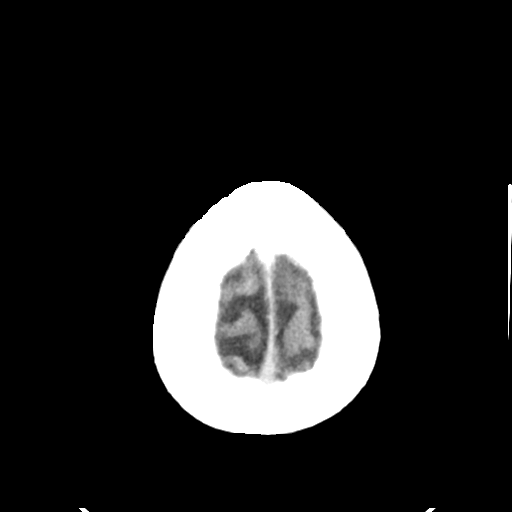

[Series 4: head bone · axial · 0.45mm/px · z∈[-180,-120]mm · 4 of 86 slices shown]
[im 9/86  bone]
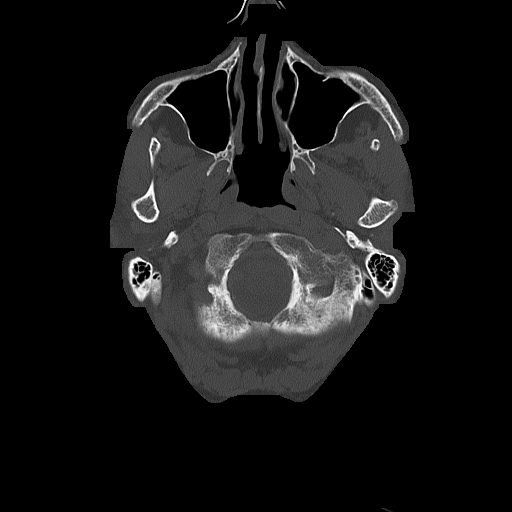
[im 18/86  bone]
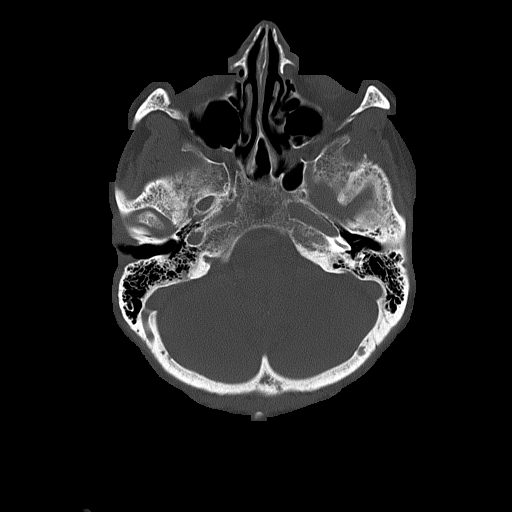
[im 26/86  bone]
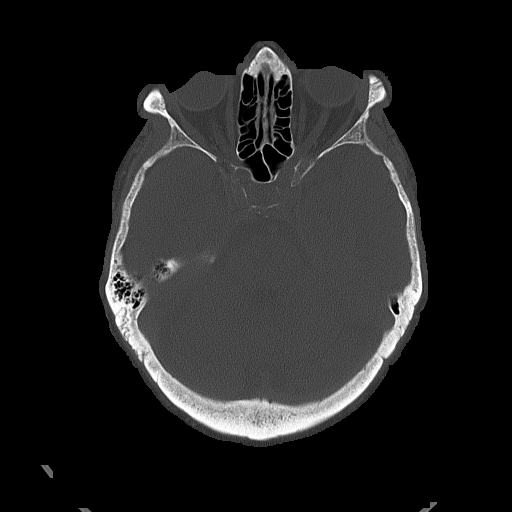
[im 39/86  bone]
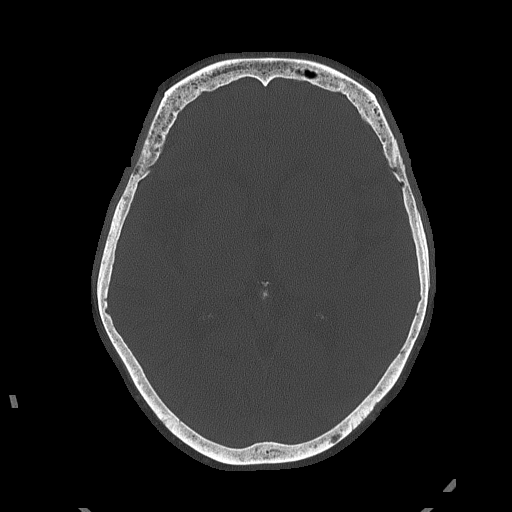

[Series 5: head without cor · coronal · non-contrast · 0.33mm/px · 3 of 67 slices shown]
[im 23/67  brain]
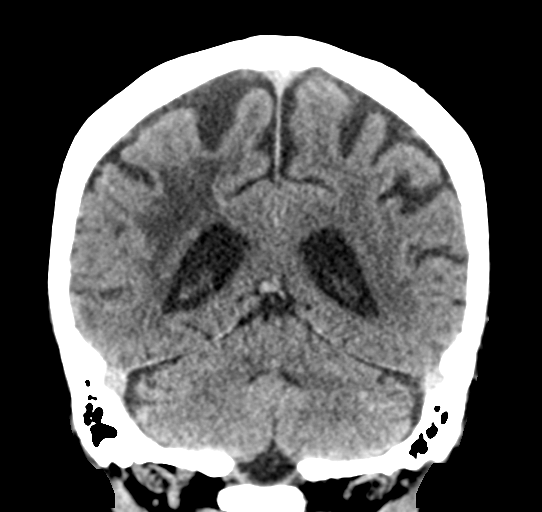
[im 30/67  brain]
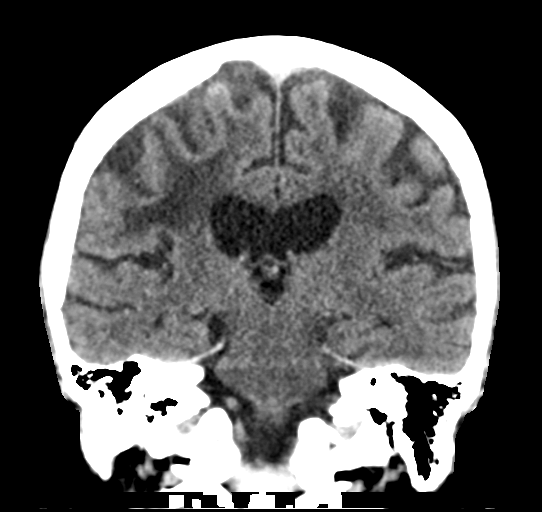
[im 37/67  brain]
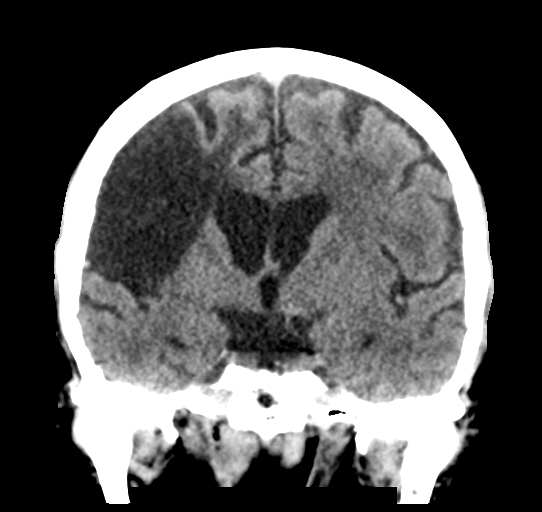

[Series 6: head without sag · sagittal · non-contrast · 0.33mm/px · 3 of 67 slices shown]
[im 23/67  brain]
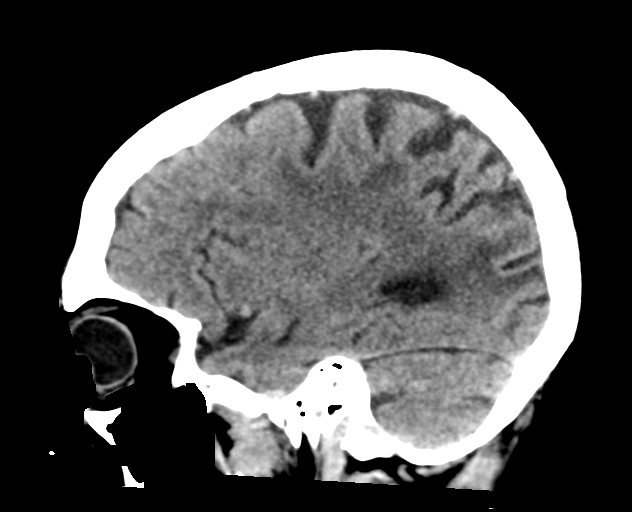
[im 34/67  brain]
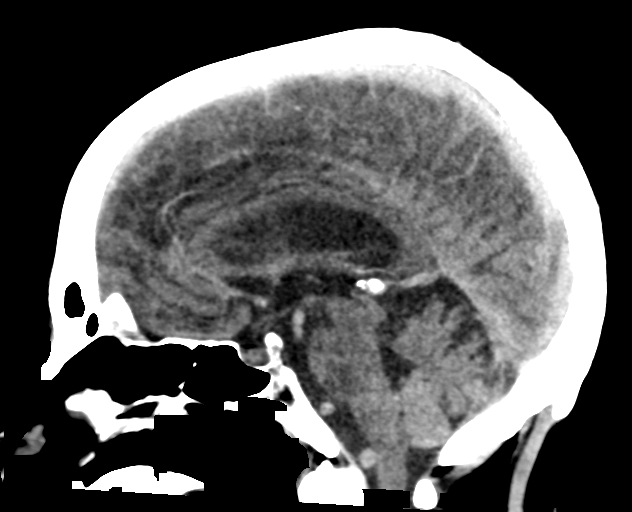
[im 45/67  brain]
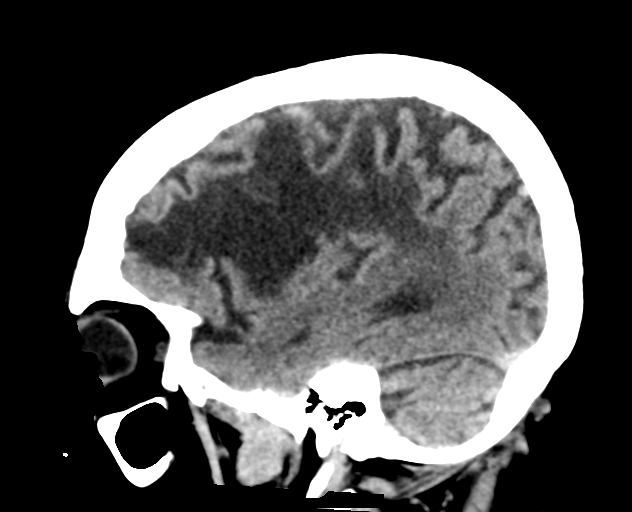

[17 of 47 positions shown; findings below may reference images not displayed]

FINDINGS: Brain: No evidence of acute infarction, hemorrhage, hydrocephalus,
extra-axial collection or mass lesion/mass effect. Periventricular
and deep white matter hypodensity. Encephalomalacia of the right
frontal lobe and left parietal lobe.

Vascular: No hyperdense vessel or unexpected calcification.

Skull: Normal. Negative for fracture or focal lesion.

Sinuses/Orbits: No acute finding.

Other: None.
IMPRESSION: 1.  No acute intracranial pathology.

2. Advanced small-vessel white matter disease and encephalomalacia
of the right frontal lobe and left parietal lobe.

## 2020-03-07 ENCOUNTER — Telehealth: Payer: Self-pay

## 2020-03-07 NOTE — Telephone Encounter (Signed)
Prior authorization renewal submitted and approved for NUEDEXTA 20-10 MG capsules with [employer] PA# [id number] effective 01/25/2020-02/23/2021

## 2020-05-03 IMAGING — DX DG CHEST 1V PORT
1 series · 1 of 1 positions shown · non-contrast
Comparison: CT chest 04/10/2006.

CLINICAL DATA: Altered mental status.

EXAM:
PORTABLE CHEST 1 VIEW

[chest]
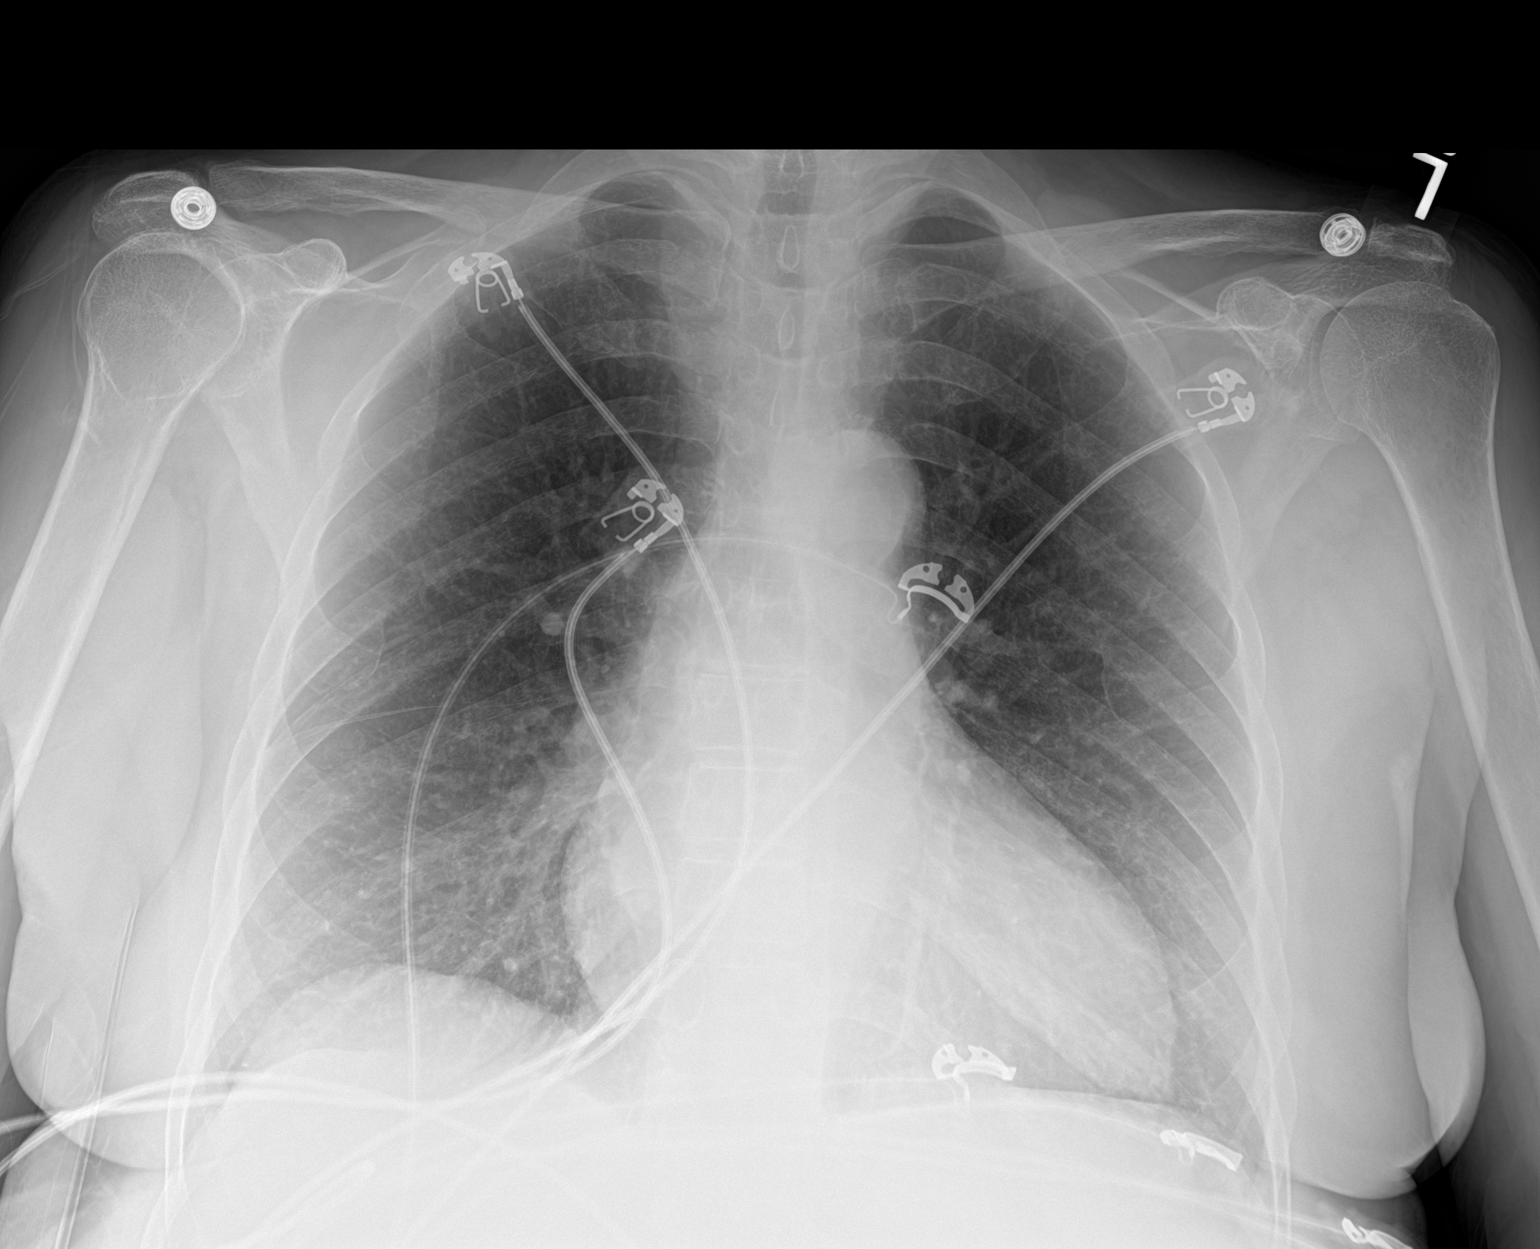

[1 of 1 positions shown; findings below may reference images not displayed]

FINDINGS: Trachea is midline. Heart is at the upper limits of normal in size.
Lungs are clear. No pleural fluid.
IMPRESSION: No acute findings.

## 2020-06-21 DEATH — deceased
